# Patient Record
Sex: Male | Born: 1935 | Race: White | Hispanic: No | Marital: Married | Smoking: Never smoker
Health system: Southern US, Community
[De-identification: ages and names within clinical notes are randomized; demographics above are authoritative.]

## PROBLEM LIST (undated history)

## (undated) DIAGNOSIS — G001 Pneumococcal meningitis: Secondary | ICD-10-CM

## (undated) DIAGNOSIS — K225 Diverticulum of esophagus, acquired: Secondary | ICD-10-CM

## (undated) DIAGNOSIS — C61 Malignant neoplasm of prostate: Secondary | ICD-10-CM

## (undated) HISTORY — DX: Diverticulum of esophagus, acquired: K22.5

## (undated) HISTORY — DX: Pneumococcal meningitis: G00.1

## (undated) HISTORY — DX: Malignant neoplasm of prostate: C61

## (undated) HISTORY — PX: BRAIN SURGERY: SHX531

---

## 1983-12-29 DIAGNOSIS — I341 Nonrheumatic mitral (valve) prolapse: Secondary | ICD-10-CM

## 1983-12-29 HISTORY — DX: Nonrheumatic mitral (valve) prolapse: I34.1

## 2007-12-29 HISTORY — PX: PROSTATE CRYOABLATION: SUR358

## 2012-12-28 HISTORY — PX: TESTICLE REMOVAL: SHX68

## 2021-10-27 ENCOUNTER — Other Ambulatory Visit: Payer: Self-pay

## 2021-10-27 ENCOUNTER — Encounter: Payer: Self-pay | Admitting: Adult Health

## 2021-10-27 ENCOUNTER — Non-Acute Institutional Stay: Payer: Medicare Other | Admitting: Adult Health

## 2021-10-27 VITALS — BP 138/72 | HR 57 | Temp 96.4°F | Ht 73.0 in | Wt 187.8 lb

## 2021-10-27 DIAGNOSIS — R269 Unspecified abnormalities of gait and mobility: Secondary | ICD-10-CM

## 2021-10-27 DIAGNOSIS — K5901 Slow transit constipation: Secondary | ICD-10-CM

## 2021-10-27 DIAGNOSIS — C61 Malignant neoplasm of prostate: Secondary | ICD-10-CM | POA: Diagnosis not present

## 2021-10-27 DIAGNOSIS — I341 Nonrheumatic mitral (valve) prolapse: Secondary | ICD-10-CM | POA: Diagnosis not present

## 2021-10-27 MED ORDER — SULFAMETHOXAZOLE-TRIMETHOPRIM 800-160 MG PO TABS: 1.0000 | ORAL_TABLET | Freq: Two times a day (BID) | ORAL | 1 refills | Status: DC
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
## 2021-10-28 LAB — LIPID PANEL
Cholesterol: 168 (ref 0–200)
HDL: 52 (ref 35–70)
LDL Cholesterol: 98
LDl/HDL Ratio: 3.2
Triglycerides: 93 (ref 40–160)

## 2021-10-28 LAB — CBC AND DIFFERENTIAL
HCT: 38 — AB (ref 41–53)
Hemoglobin: 13 — AB (ref 13.5–17.5)
Platelets: 224 (ref 150–399)
WBC: 7.4

## 2021-10-28 LAB — BASIC METABOLIC PANEL
BUN: 30 — AB (ref 4–21)
CO2: 22 (ref 13–22)
Chloride: 107 (ref 99–108)
Creatinine: 1.2 (ref 0.6–1.3)
Glucose: 103
Potassium: 5 (ref 3.4–5.3)
Sodium: 140 (ref 137–147)

## 2021-10-28 LAB — COMPREHENSIVE METABOLIC PANEL
Albumin: 4 (ref 3.5–5.0)
Calcium: 8.8 (ref 8.7–10.7)
Globulin: 2.3

## 2021-10-28 LAB — HEPATIC FUNCTION PANEL
ALT: 13 (ref 10–40)
AST: 16 (ref 14–40)
Alkaline Phosphatase: 76 (ref 25–125)

## 2021-10-28 LAB — CBC: RBC: 4.06 (ref 3.87–5.11)

## 2021-10-28 LAB — TSH: TSH: 2.02 (ref 0.41–5.90)

## 2021-11-04 ENCOUNTER — Telehealth: Payer: Self-pay | Admitting: *Deleted

## 2021-11-04 NOTE — Telephone Encounter (Signed)
Patient called and stated that he had blood work done a week ago and he is wanting to know the results from it.   Please Advise.

## 2021-11-04 NOTE — Telephone Encounter (Signed)
I have contacted Aaron Harmon the clinic nurse at [place] and she will notify him of his lab results.

## 2022-01-20 ENCOUNTER — Emergency Department (HOSPITAL_COMMUNITY): Payer: Medicare Other

## 2022-01-20 ENCOUNTER — Other Ambulatory Visit: Payer: Self-pay

## 2022-01-20 ENCOUNTER — Inpatient Hospital Stay (HOSPITAL_COMMUNITY)
Admission: EM | Admit: 2022-01-20 | Discharge: 2022-01-24 | DRG: 378 | Disposition: A | Discharge status: Home / Self Care | Payer: Medicare Other | Attending: Internal Medicine | Admitting: Internal Medicine

## 2022-01-20 ENCOUNTER — Encounter (HOSPITAL_COMMUNITY): Payer: Self-pay | Admitting: Emergency Medicine

## 2022-01-20 DIAGNOSIS — D61818 Other pancytopenia: Secondary | ICD-10-CM | POA: Diagnosis present

## 2022-01-20 DIAGNOSIS — Z23 Encounter for immunization: Secondary | ICD-10-CM

## 2022-01-20 DIAGNOSIS — Z792 Long term (current) use of antibiotics: Secondary | ICD-10-CM

## 2022-01-20 DIAGNOSIS — K922 Gastrointestinal hemorrhage, unspecified: Secondary | ICD-10-CM

## 2022-01-20 DIAGNOSIS — K5731 Diverticulosis of large intestine without perforation or abscess with bleeding: Secondary | ICD-10-CM | POA: Diagnosis not present

## 2022-01-20 DIAGNOSIS — Z9079 Acquired absence of other genital organ(s): Secondary | ICD-10-CM

## 2022-01-20 DIAGNOSIS — I341 Nonrheumatic mitral (valve) prolapse: Secondary | ICD-10-CM | POA: Diagnosis present

## 2022-01-20 DIAGNOSIS — Z79899 Other long term (current) drug therapy: Secondary | ICD-10-CM

## 2022-01-20 DIAGNOSIS — K648 Other hemorrhoids: Secondary | ICD-10-CM | POA: Diagnosis present

## 2022-01-20 DIAGNOSIS — Z881 Allergy status to other antibiotic agents status: Secondary | ICD-10-CM

## 2022-01-20 DIAGNOSIS — Z8546 Personal history of malignant neoplasm of prostate: Secondary | ICD-10-CM

## 2022-01-20 DIAGNOSIS — K635 Polyp of colon: Secondary | ICD-10-CM | POA: Diagnosis present

## 2022-01-20 DIAGNOSIS — Z800 Family history of malignant neoplasm of digestive organs: Secondary | ICD-10-CM

## 2022-01-20 DIAGNOSIS — Z20822 Contact with and (suspected) exposure to covid-19: Secondary | ICD-10-CM | POA: Diagnosis present

## 2022-01-20 DIAGNOSIS — D62 Acute posthemorrhagic anemia: Secondary | ICD-10-CM | POA: Diagnosis present

## 2022-01-20 DIAGNOSIS — Z66 Do not resuscitate: Secondary | ICD-10-CM | POA: Diagnosis present

## 2022-01-20 DIAGNOSIS — Z8661 Personal history of infections of the central nervous system: Secondary | ICD-10-CM

## 2022-01-20 LAB — CBC
HCT: 24 % — ABNORMAL LOW (ref 39.0–52.0)
Hemoglobin: 8.1 g/dL — ABNORMAL LOW (ref 13.0–17.0)
MCH: 31.8 pg (ref 26.0–34.0)
MCHC: 33.8 g/dL (ref 30.0–36.0)
MCV: 94.1 fL (ref 80.0–100.0)
Platelets: 92 10*3/uL — ABNORMAL LOW (ref 150–400)
RBC: 2.55 MIL/uL — ABNORMAL LOW (ref 4.22–5.81)
RDW: 15.5 % (ref 11.5–15.5)
WBC: 1.4 10*3/uL — CL (ref 4.0–10.5)
nRBC: 0 % (ref 0.0–0.2)

## 2022-01-20 LAB — PROTIME-INR
INR: 1 (ref 0.8–1.2)
Prothrombin Time: 13.6 seconds (ref 11.4–15.2)

## 2022-01-20 LAB — COMPREHENSIVE METABOLIC PANEL
ALT: 16 U/L (ref 0–44)
AST: 18 U/L (ref 15–41)
Albumin: 3.9 g/dL (ref 3.5–5.0)
Alkaline Phosphatase: 59 U/L (ref 38–126)
Anion gap: 6 (ref 5–15)
BUN: 29 mg/dL — ABNORMAL HIGH (ref 8–23)
CO2: 24 mmol/L (ref 22–32)
Calcium: 8.8 mg/dL — ABNORMAL LOW (ref 8.9–10.3)
Chloride: 108 mmol/L (ref 98–111)
Creatinine, Ser: 1.12 mg/dL (ref 0.61–1.24)
GFR, Estimated: >60 mL/min (ref >60)
Glucose, Bld: 129 mg/dL — ABNORMAL HIGH (ref 70–99)
Potassium: 4.2 mmol/L (ref 3.5–5.1)
Sodium: 138 mmol/L (ref 135–145)
Total Bilirubin: 0.8 mg/dL (ref 0.3–1.2)
Total Protein: 6.7 g/dL (ref 6.5–8.1)

## 2022-01-20 LAB — POC OCCULT BLOOD, ED: Fecal Occult Bld: POSITIVE — AB

## 2022-01-20 MED ORDER — LACTATED RINGERS IV BOLUS
1000.0000 mL | Freq: Once | INTRAVENOUS | Status: AC
  Administered 2022-01-20: 22:00:00 1000 mL via INTRAVENOUS

## 2022-01-20 MED ORDER — IOHEXOL 350 MG/ML SOLN
100.0000 mL | Freq: Once | INTRAVENOUS | Status: AC | PRN
  Administered 2022-01-20: 23:00:00 100 mL via INTRAVENOUS

## 2022-01-20 MED ORDER — PANTOPRAZOLE SODIUM 40 MG IV SOLR
40.0000 mg | Freq: Once | INTRAVENOUS | Status: AC
Start: 2022-01-20 — End: 2022-01-20
  Administered 2022-01-20: 22:00:00 40 mg via INTRAVENOUS
  Filled 2022-01-20: qty 40

## 2022-01-20 MED ORDER — SODIUM CHLORIDE 0.9 % IV SOLN
10.0000 mL/h | Freq: Once | INTRAVENOUS | Status: AC
  Administered 2022-01-21: 02:00:00 10 mL/h via INTRAVENOUS

## 2022-01-20 NOTE — ED Notes (Addendum)
Pt was unable to stand up to do the 3 minute stand ortho vital. Pt says it was because he was starting to get very dizzy.

## 2022-01-20 NOTE — ED Triage Notes (Signed)
Pt arrived via EMS from [place]. Pt had a bowel movement today and reports 50cc of bright red blood. Pt denies pain, nausea, or vomiting

## 2022-01-20 NOTE — ED Provider Notes (Signed)
[place] DEPT Provider Note   CSN: [id number] Arrival date & time: 01/20/22  2000     History  Chief Complaint  Patient presents with   Rectal Bleeding    Aaron Harmon is a 88 y.o. male.  Is a history of constipation and takes Metamucil daily.  He had a normal bowel movement this morning and had some blood and clots on the toilet paper although no red in the pool.  This evening while he was urinating he felt like he needed to pass gas and had what he thinks was 50 to 100 cc of red blood with some clots.  No abdominal pain.  Not on any blood thinners.  Did feel little dizzy.  Denies any chest pain or shortness of breath.  Does not have a GI doctor.  The history is provided by the patient.  Rectal Bleeding Quality:  Maroon Amount:  Moderate Timing:  Sporadic Chronicity:  New Context: defecation and spontaneously   Context: not rectal pain   Similar prior episodes: no   Relieved by:  None tried Worsened by:  Nothing Ineffective treatments:  None tried Associated symptoms: dizziness   Associated symptoms: no abdominal pain, no epistaxis, no fever, no hematemesis, no loss of consciousness and no vomiting   Risk factors: no anticoagulant use       Home Medications Prior to Admission medications   Medication Sig Start Date End Date Taking? Authorizing Provider  MAGNESIUM CITRATE PO Take 500 mg by mouth daily.    [provider]  psyllium (METAMUCIL) 58.6 % powder Take 1 packet by mouth daily.    [provider]  sulfamethoxazole-trimethoprim (BACTRIM DS) 800-160 MG tablet Take 1 tablet by mouth 2 (two) times daily as needed.    [provider]      Allergies    Ciprofloxacin    Review of Systems   Review of Systems  Constitutional:  Negative for fever.  HENT:  Negative for nosebleeds.   Eyes:  Negative for visual disturbance.  Respiratory:  Negative for cough.   Cardiovascular:  Negative for chest pain.   Gastrointestinal:  Positive for anal bleeding, blood in stool and hematochezia. Negative for abdominal pain, hematemesis, nausea and vomiting.  Genitourinary:  Negative for dysuria.  Musculoskeletal:  Negative for neck pain.  Skin:  Negative for rash.  Neurological:  Positive for dizziness. Negative for loss of consciousness.   Physical Exam Updated Vital Signs BP (!) 151/75    Pulse 75    Temp 97.8 F (36.6 C) (Oral)    Resp 20    Ht 6\' 1"  (1.854 m)    Wt 81.6 kg    SpO2 98%    BMI 23.75 kg/m  Physical Exam Vitals and nursing note reviewed.  Constitutional:      General: He is not in acute distress.    Appearance: Normal appearance. He is well-developed.  HENT:     Head: Normocephalic and atraumatic.  Eyes:     Conjunctiva/sclera: Conjunctivae normal.  Cardiovascular:     Rate and Rhythm: Normal rate and regular rhythm.     Heart sounds: Murmur heard.  Pulmonary:     Effort: Pulmonary effort is normal. No respiratory distress.     Breath sounds: Normal breath sounds.  Abdominal:     Palpations: Abdomen is soft.     Tenderness: There is no abdominal tenderness. There is no guarding or rebound.  Musculoskeletal:        General: No  swelling.     Cervical back: Neck supple.     Right lower leg: No edema.     Left lower leg: No edema.  Skin:    General: Skin is warm and dry.     Capillary Refill: Capillary refill takes less than 2 seconds.  Neurological:     General: No focal deficit present.     Mental Status: He is alert.  Psychiatric:        Mood and Affect: Mood normal.    ED Results / Procedures / Treatments   Labs (all labs ordered are listed, but only abnormal results are displayed) Labs Reviewed  COMPREHENSIVE METABOLIC PANEL - Abnormal; Notable for the following components:      Result Value   Glucose, Bld 129 (*)    BUN 29 (*)    Calcium 8.8 (*)    All other components within normal limits  CBC - Abnormal; Notable for the following components:   WBC 1.4  (*)    RBC 2.55 (*)    Hemoglobin 8.1 (*)    HCT 24.0 (*)    Platelets 92 (*)    All other components within normal limits  POC OCCULT BLOOD, ED - Abnormal; Notable for the following components:   Fecal Occult Bld POSITIVE (*)    All other components within normal limits  RESP PANEL BY RT-PCR (FLU A&B, COVID) ARPGX2  PROTIME-INR  BASIC METABOLIC PANEL  CBC WITH DIFFERENTIAL/PLATELET  TYPE AND SCREEN  PREPARE RBC (CROSSMATCH)  ABO/RH    EKG EKG Interpretation  Date/Time:  Tuesday January 20 2022 20:18:09 EST Ventricular Rate:  69 PR Interval:  204 QRS Duration: 151 QT Interval:  437 QTC Calculation: 469 R Axis:   39 Text Interpretation: Sinus rhythm Probable left atrial enlargement Right bundle branch block No old tracing to compare Confirmed by Aletta Edouard [phone]) on 01/20/2022 10:01:11 PM  Radiology CT Angio Abd/Pel W and/or Wo Contrast  Result Date: 01/20/2022 CLINICAL DATA:  GI bleed, lower.  Bright red blood EXAM: CTA ABDOMEN AND PELVIS WITHOUT AND WITH CONTRAST TECHNIQUE: Multidetector CT imaging of the abdomen and pelvis was performed using the standard protocol during bolus administration of intravenous contrast. Multiplanar reconstructed images and MIPs were obtained and reviewed to evaluate the vascular anatomy. RADIATION DOSE REDUCTION: This exam was performed according to the departmental dose-optimization program which includes automated exposure control, adjustment of the mA and/or kV according to patient size and/or use of iterative reconstruction technique. CONTRAST:  129mL OMNIPAQUE IOHEXOL 350 MG/ML SOLN COMPARISON:  None. FINDINGS: VASCULAR Aorta: Aortic atherosclerosis.  No aneurysm or dissection. Celiac: Patent. Mild stenosis proximally likely related to median arcuate ligament. SMA: Patent. Renals: Patent bilaterally. Small left lower pole accessory renal artery. IMA: Patent Inflow: Atherosclerotic disease, patent. Proximal Outflow: Mild atherosclerotic  disease, patent. Veins: No obvious venous abnormality within the limitations of this arterial phase study. Review of the MIP images confirms the above findings. NON-VASCULAR Lower chest: No acute abnormality. Hepatobiliary: No focal hepatic abnormality. Gallbladder unremarkable. Pancreas: No focal abnormality or ductal dilatation. Spleen: Normal size. 2 cm lower density lesion in the upper pole of the spleen, likely cyst or hemangioma. Adrenals/Urinary Tract: Normal adrenal glands. Bilateral renal cysts. 16 mm stone layering in the left renal pelvis. Mild fullness of the left renal pelvis and collecting system. Ureters are decompressed. This may be related to chronic UPJ obstruction. Urinary bladder unremarkable. Stomach/Bowel: Diffuse colonic diverticulosis, most pronounced in the left colon. No active diverticulitis. No visible active  contrast extravasation to localize GI bleed. Stomach and small bowel decompressed, unremarkable. Lymphatic: No adenopathy Reproductive: Prostate calcifications. Other: No free fluid or free air. Musculoskeletal: No acute bony abnormality. IMPRESSION: VASCULAR Diffuse aortic atherosclerosis. No evidence of aneurysm or dissection. No contrast extravasation within the colon to localize GI bleed. NON-VASCULAR Extensive colonic diverticulosis.  No active diverticulitis. 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 16 mm nonobstructing stone layering dependently in the left renal pelvis. Bilateral renal cysts. Electronically Signed   By: Rolm Baptise M.D.   On: 01/20/2022 23:14    Procedures .Critical Care Performed by: Hayden Rasmussen, MD Authorized by: Hayden Rasmussen, MD   Critical care provider statement:    Critical care time (minutes):  45   Critical care time was exclusive of:  Separately billable procedures and treating other patients   Critical care was necessary to treat or prevent imminent or life-threatening  deterioration of the following conditions:  Circulatory failure   Critical care was time spent personally by me on the following activities:  Development of treatment plan with patient or surrogate, discussions with consultants, evaluation of patient's response to treatment, examination of patient, obtaining history from patient or surrogate, ordering and performing treatments and interventions, ordering and review of laboratory studies, ordering and review of radiographic studies, pulse oximetry, re-evaluation of patient's condition and review of old charts   I assumed direction of critical care for this patient from another provider in my specialty: no      Medications Ordered in ED Medications  acetaminophen (TYLENOL) tablet 650 mg (has no administration in time range)    Or  acetaminophen (TYLENOL) suppository 650 mg (has no administration in time range)  lactated ringers infusion (0 mLs Intravenous Hold 01/21/22 0147)  pantoprazole (PROTONIX) injection 40 mg (has no administration in time range)  lactated ringers bolus 1,000 mL (0 mLs Intravenous Stopped 01/21/22 0053)  pantoprazole (PROTONIX) injection 40 mg (40 mg Intravenous Given 01/20/22 2201)  0.9 %  sodium chloride infusion (10 mL/hr Intravenous New Bag/Given 01/21/22 0225)  iohexol (OMNIPAQUE) 350 MG/ML injection 100 mL (100 mLs Intravenous Contrast Given 01/20/22 2252)    ED Course/ Medical Decision Making/ A&P Clinical Course as of 01/21/22 0913  Tue Jan 20, 2022  2148 Rectal exam done with tech as chaperone.  Normal tone no masses.  Maroon stool in vault [MB]  2329 Discussed with GI on-call Dr. Fabio Pierce who will see him in the morning unless becomes unstable. [MB]  2336 Discussed with Dr. Hal Hope [facility] who will evaluate the patient for admission. [MB]    Clinical Course User Index [MB] Hayden Rasmussen, MD                           Medical Decision Making Amount and/or Complexity of Data  Reviewed Labs: ordered. Radiology: ordered.  Risk Prescription drug management. Decision regarding hospitalization.  This patient complains of maroon blood from rectum; this involves an extensive number of treatment Options and is a complaint that carries with it a high risk of complications and Morbidity. The differential includes upper GI bleed, lower GI bleed, AVM diverticulitis, anemia, shock  I ordered, reviewed and interpreted labs, which included CBC which shows pancytopenia and marked drop in hemoglobin from prior 2 months ago, chemistries fairly unremarkable, INR normal, fecal occult positive I ordered medication IV fluids IV PPI transfusion of packed red blood cells after consent  I ordered imaging studies which included CT angio abdomen and pelvis and I independently    visualized and interpreted imaging which showed diverticulosis, no localization of active bleed Additional history obtained from patient's wife Previous records obtained and reviewed in epic no recent admissions I consulted Dr. Fabio Pierce GI and Dr. Hal Hope [facility] and discussed lab and imaging findings  Critical Interventions: Transfusion of packed red blood cells for active GI bleed  After the interventions stated above, I reevaluated the patient and found patient to be fairly comfortable appearing.  Blood transfusion still pending.  Reviewed results of work-up with patient and he is agreeable to admission for further evaluation.           Final Clinical Impression(s) / ED Diagnoses Final diagnoses:  Gastrointestinal hemorrhage, unspecified gastrointestinal hemorrhage type  Pancytopenia [facility])    Rx / DC Orders ED Discharge Orders     None         Hayden Rasmussen, MD 01/21/22 [phone]

## 2022-01-21 ENCOUNTER — Encounter (HOSPITAL_COMMUNITY): Payer: Self-pay | Admitting: Internal Medicine

## 2022-01-21 DIAGNOSIS — D62 Acute posthemorrhagic anemia: Secondary | ICD-10-CM | POA: Diagnosis present

## 2022-01-21 DIAGNOSIS — Z9079 Acquired absence of other genital organ(s): Secondary | ICD-10-CM | POA: Diagnosis not present

## 2022-01-21 DIAGNOSIS — Z881 Allergy status to other antibiotic agents status: Secondary | ICD-10-CM | POA: Diagnosis not present

## 2022-01-21 DIAGNOSIS — Z8546 Personal history of malignant neoplasm of prostate: Secondary | ICD-10-CM | POA: Diagnosis not present

## 2022-01-21 DIAGNOSIS — Z66 Do not resuscitate: Secondary | ICD-10-CM | POA: Diagnosis present

## 2022-01-21 DIAGNOSIS — Z792 Long term (current) use of antibiotics: Secondary | ICD-10-CM | POA: Diagnosis not present

## 2022-01-21 DIAGNOSIS — Z800 Family history of malignant neoplasm of digestive organs: Secondary | ICD-10-CM | POA: Diagnosis not present

## 2022-01-21 DIAGNOSIS — K635 Polyp of colon: Secondary | ICD-10-CM | POA: Diagnosis present

## 2022-01-21 DIAGNOSIS — K5731 Diverticulosis of large intestine without perforation or abscess with bleeding: Secondary | ICD-10-CM | POA: Diagnosis present

## 2022-01-21 DIAGNOSIS — Z20822 Contact with and (suspected) exposure to covid-19: Secondary | ICD-10-CM | POA: Diagnosis present

## 2022-01-21 DIAGNOSIS — Z23 Encounter for immunization: Secondary | ICD-10-CM | POA: Diagnosis not present

## 2022-01-21 DIAGNOSIS — I341 Nonrheumatic mitral (valve) prolapse: Secondary | ICD-10-CM | POA: Diagnosis present

## 2022-01-21 DIAGNOSIS — D61818 Other pancytopenia: Secondary | ICD-10-CM | POA: Diagnosis present

## 2022-01-21 DIAGNOSIS — K648 Other hemorrhoids: Secondary | ICD-10-CM | POA: Diagnosis present

## 2022-01-21 DIAGNOSIS — K922 Gastrointestinal hemorrhage, unspecified: Secondary | ICD-10-CM | POA: Diagnosis present

## 2022-01-21 DIAGNOSIS — Z8661 Personal history of infections of the central nervous system: Secondary | ICD-10-CM | POA: Diagnosis not present

## 2022-01-21 DIAGNOSIS — Z79899 Other long term (current) drug therapy: Secondary | ICD-10-CM | POA: Diagnosis not present

## 2022-01-21 LAB — CBC WITH DIFFERENTIAL/PLATELET
Abs Immature Granulocytes: 0.03 10*3/uL (ref 0.00–0.07)
Basophils Absolute: 0 10*3/uL (ref 0.0–0.1)
Basophils Relative: 0 %
Eosinophils Absolute: 0.1 10*3/uL (ref 0.0–0.5)
Eosinophils Relative: 1 %
HCT: 39.7 % (ref 39.0–52.0)
Hemoglobin: 13.7 g/dL (ref 13.0–17.0)
Immature Granulocytes: 0 %
Lymphocytes Relative: 16 %
Lymphs Abs: 1.2 10*3/uL (ref 0.7–4.0)
MCH: 31.6 pg (ref 26.0–34.0)
MCHC: 34.5 g/dL (ref 30.0–36.0)
MCV: 91.5 fL (ref 80.0–100.0)
Monocytes Absolute: 0.6 10*3/uL (ref 0.1–1.0)
Monocytes Relative: 7 %
Neutro Abs: 5.8 10*3/uL (ref 1.7–7.7)
Neutrophils Relative %: 76 %
Platelets: 174 10*3/uL (ref 150–400)
RBC: 4.34 MIL/uL (ref 4.22–5.81)
RDW: 14.6 % (ref 11.5–15.5)
WBC: 7.6 10*3/uL (ref 4.0–10.5)
nRBC: 0 % (ref 0.0–0.2)

## 2022-01-21 LAB — BASIC METABOLIC PANEL
Anion gap: 7 (ref 5–15)
BUN: 25 mg/dL — ABNORMAL HIGH (ref 8–23)
CO2: 25 mmol/L (ref 22–32)
Calcium: 8.4 mg/dL — ABNORMAL LOW (ref 8.9–10.3)
Chloride: 106 mmol/L (ref 98–111)
Creatinine, Ser: 0.89 mg/dL (ref 0.61–1.24)
GFR, Estimated: >60 mL/min (ref >60)
Glucose, Bld: 100 mg/dL — ABNORMAL HIGH (ref 70–99)
Potassium: 3.9 mmol/L (ref 3.5–5.1)
Sodium: 138 mmol/L (ref 135–145)

## 2022-01-21 LAB — RESP PANEL BY RT-PCR (FLU A&B, COVID) ARPGX2
Influenza A by PCR: NEGATIVE
Influenza B by PCR: NEGATIVE
SARS Coronavirus 2 by RT PCR: NEGATIVE

## 2022-01-21 LAB — GLUCOSE, CAPILLARY: Glucose-Capillary: 94 mg/dL (ref 70–99)

## 2022-01-21 LAB — ABO/RH: ABO/RH(D): O NEG

## 2022-01-21 LAB — PREPARE RBC (CROSSMATCH)

## 2022-01-21 MED ORDER — ACETAMINOPHEN 650 MG RE SUPP: 650.0000 mg | Freq: Four times a day (QID) | RECTAL | Status: DC | PRN

## 2022-01-21 MED ORDER — PANTOPRAZOLE SODIUM 40 MG IV SOLR: 40.0000 mg | Freq: Two times a day (BID) | INTRAVENOUS | Status: DC

## 2022-01-21 MED ORDER — PNEUMOCOCCAL VAC POLYVALENT 25 MCG/0.5ML IJ INJ
0.5000 mL | INJECTION | INTRAMUSCULAR | Status: DC
  Filled 2022-01-21: qty 0.5

## 2022-01-21 MED ORDER — ACETAMINOPHEN 325 MG PO TABS: 650.0000 mg | ORAL_TABLET | Freq: Four times a day (QID) | ORAL | Status: DC | PRN

## 2022-01-21 MED ORDER — PANTOPRAZOLE SODIUM 40 MG IV SOLR
40.0000 mg | Freq: Two times a day (BID) | INTRAVENOUS | Status: DC
  Administered 2022-01-21 – 2022-01-24 (×5): 40 mg via INTRAVENOUS
  Filled 2022-01-21 (×6): qty 40

## 2022-01-21 MED ORDER — LACTATED RINGERS IV SOLN: INTRAVENOUS | Status: AC

## 2022-01-21 NOTE — Progress Notes (Signed)
PROGRESS NOTE  Edrian Melucci XFG:[id number] DOB: [birth year] DOA: 01/20/2022 PCP: Virgie Dad, MD  Brief History   Elton Heid is a 88 y.o. male with history of mitral valve prolapse, prostate cancer status post prostatectomy presents to the ER after patient had a large bloody bowel movement.  Patient states yesterday morning when he went to the bathroom and wiped he had some blood in the tissue.  Later in the evening when he went to the bathroom he had a large bloody bowel movement.  At this point he decided to come to the ER.  Denies any abdominal pain nausea vomiting.  Has been feeling weak since morning.  Does not take any blood thinners.   ED Course: In the ER patient had another bowel movement which was bloody.  Patient's hemoglobin did drop 5 g when compared to November 2022 about 2 months ago.  Patient blood work also showed pancytopenia when compared to the 1 done 2 months ago.  CT angiogram of the abdomen pelvis did not show any signs of active GI bleed.  ER physician discussed with the on-call gastroenterologist Dr. Alessandra Bevels will be seeing patient in consult.  Patient has been ordered 2 units of PRBC given the magnitude of drop in hemoglobin and having another episode of GI bleed.  COVID test negative.  CBC reveals pancytopenia. Etiology is unknown. Will follow CBC.  The patient has been evaluated by Dr. Therisa Doyne. She has recommended inpatient observation for another 24 hours and states that the patient may be discharged to home in am if no further bleeding by tomorrow am. If he does demonstrate further hematochezia or hemodynamic instability, he recommends IR eval for embolization. This is assumed to be a diverticular bleed.  Consultants  Gastroenterology  Procedures  None  Antibiotics   Anti-infectives (From admission, onward)    None      Subjective  The patient is resting comfortably. No further bloody bowel movements. No new complaints.   Objective   Vitals:   Vitals:   01/21/22 1600 01/21/22 1715  BP: (!) 170/84 (!) 166/73  Pulse: 73 68  Resp: 18 17  Temp:  97.8 F (36.6 C)  SpO2: 96% 99%    Exam:  Constitutional:  Appears calm and comfortable Eyes:  pupils and irises appear normal Normal lids and conjunctivae ENMT:  grossly normal hearing  Lips appear normal external ears, nose appear normal Oropharynx: mucosa, tongue,posterior pharynx appear normal Neck:  neck appears normal, no masses, normal ROM, supple no thyromegaly Respiratory:  CTA bilaterally, no w/r/r.  Respiratory effort normal. No retractions or accessory muscle use Cardiovascular:  RRR, no m/r/g No LE extremity edema   Normal pedal pulses Abdomen:  Abdomen appears normal; no tenderness or masses No hernias No HSM Musculoskeletal:  Digits/nails BUE: no clubbing, cyanosis, petechiae, infection exam of joints, bones, muscles of at least one of following: head/neck, RUE, LUE, RLE, LLE   strength and tone normal, no atrophy, no abnormal movements No tenderness, masses Normal ROM, no contractures  gait and station Skin:  No rashes, lesions, ulcers palpation of skin: no induration or nodules Neurologic:  CN 2-12 intact Sensation all 4 extremities intact Psychiatric:  Mental status Mood, affect appropriate Orientation to person, place, time  judgment and insight appear intact     I have personally reviewed the following:   Today's Data  Vitals  Lab Data  CBC  Micro Data    Imaging  CT abdomen and pelvis  Cardiology Data  EKG  Other Data    Scheduled Meds:  pantoprazole (PROTONIX) IV  40 mg Intravenous Q12H   Continuous Infusions:  lactated ringers 75 mL/hr at 01/21/22 1716    Principal Problem:   Acute GI bleeding Active Problems:   Pancytopenia ([place])   GI bleed   LOS: 0 days   A & P  Acute GI bleeding -CT scan does show some diverticulosis.  Patient states he has never had a colonoscopy before. He has been evaluated by Dr.  Therisa Doyne. She has recommended inpatient observation for another 24 hours and states that the patient may be discharged to home in am if no further bleeding by tomorrow am. If he does demonstrate further hematochezia or hemodynamic instability, he recommends IR eval for embolization. This is assumed to be a diverticular bleed. Acute blood loss anemia: The patient's hemoglobin had dropped from 13 gm on 10/28/2022 to 8.0 grams upon presentation. The patient has received 2 units in transfusion. Hemoglobin this morning is 13/7. Monitor.  Pancytopenia which is new for the patient when compared to the lab work done in November 2022 2 months ago.  Follow CBC Will need hematology consult eventually. Prior history of mitral valve prolapse.  No acute issues. History of prostate cancer status post prostatectomy.     DVT prophylaxis: SCDs.  Avoiding anticoagulation in the setting of GI bleed. Code Status: Full code. Family Communication: Discussed with patient. Disposition Plan: Home when stable.   Rasheka Denard, DO [facility] Direct contact: see [email or web address]  7PM-7AM contact night coverage as above 01/21/2022, 5:33 PM  LOS: 0 days
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
## 2022-01-22 ENCOUNTER — Inpatient Hospital Stay (HOSPITAL_COMMUNITY): Payer: Medicare Other

## 2022-01-22 DIAGNOSIS — K922 Gastrointestinal hemorrhage, unspecified: Secondary | ICD-10-CM | POA: Diagnosis not present

## 2022-01-22 LAB — BPAM RBC
Blood Product Expiration Date: 202302072359
Blood Product Expiration Date: 202302072359
ISSUE DATE / TIME: 202301250211
ISSUE DATE / TIME: 202301250602
Unit Type and Rh: 9500
Unit Type and Rh: 9500

## 2022-01-22 LAB — CBC WITH DIFFERENTIAL/PLATELET
Abs Immature Granulocytes: 0.04 10*3/uL (ref 0.00–0.07)
Basophils Absolute: 0 10*3/uL (ref 0.0–0.1)
Basophils Relative: 1 %
Eosinophils Absolute: 0.2 10*3/uL (ref 0.0–0.5)
Eosinophils Relative: 3 %
HCT: 38.7 % — ABNORMAL LOW (ref 39.0–52.0)
Hemoglobin: 13 g/dL (ref 13.0–17.0)
Immature Granulocytes: 1 %
Lymphocytes Relative: 23 %
Lymphs Abs: 1.6 10*3/uL (ref 0.7–4.0)
MCH: 30.8 pg (ref 26.0–34.0)
MCHC: 33.6 g/dL (ref 30.0–36.0)
MCV: 91.7 fL (ref 80.0–100.0)
Monocytes Absolute: 0.8 10*3/uL (ref 0.1–1.0)
Monocytes Relative: 11 %
Neutro Abs: 4.5 10*3/uL (ref 1.7–7.7)
Neutrophils Relative %: 61 %
Platelets: 169 10*3/uL (ref 150–400)
RBC: 4.22 MIL/uL (ref 4.22–5.81)
RDW: 14.6 % (ref 11.5–15.5)
WBC: 7.2 10*3/uL (ref 4.0–10.5)
nRBC: 0 % (ref 0.0–0.2)

## 2022-01-22 LAB — TYPE AND SCREEN
ABO/RH(D): O NEG
Antibody Screen: NEGATIVE
Unit division: 0
Unit division: 0

## 2022-01-22 LAB — GLUCOSE, CAPILLARY: Glucose-Capillary: 92 mg/dL (ref 70–99)

## 2022-01-22 MED ORDER — TECHNETIUM TC 99M-LABELED RED BLOOD CELLS IV KIT
20.0000 | PACK | Freq: Once | INTRAVENOUS | Status: AC
  Administered 2022-01-22: 20 via INTRAVENOUS

## 2022-01-22 NOTE — Progress Notes (Signed)
Patient refused CBG's.
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
## 2022-01-22 NOTE — Progress Notes (Signed)
Patient asked for wife and daughter to be occasionally updated. [name] (wife): [phone] [place] (Daughter): [phone]  Wife and daughter both updated on patient plan of care at this time.

## 2022-01-22 NOTE — Progress Notes (Signed)
[facility] Gastroenterology Progress Note  Aaron Harmon 88 y.o. 07/21/1933  CC: Rectal bleeding   Subjective: Patient did well overnight and had no further bleeding.  Patient was set up for discharge as there is no further rectal bleeding and hemoglobin normalized to 13.0. after provider left the room, RN messaged stating that patient had a large bloody/black bowel movement.  Denies abdominal pain.  Patient denies nausea/vomiting.  ROS : Review of Systems  Gastrointestinal:  Positive for blood in stool and melena. Negative for abdominal pain, constipation, diarrhea, heartburn, nausea and vomiting.  Genitourinary:  Negative for dysuria and urgency.     Objective: Vital signs in last 24 hours: Vitals:   01/22/22 0515 01/22/22 0847  BP: (!) 139/98 (!) 153/80  Pulse: 80 72  Resp: 18 18  Temp: 97.9 F (36.6 C) 98.3 F (36.8 C)  SpO2: 95% 100%    Physical Exam:  General:  Alert, cooperative, no distress, appears stated age  Head:  Normocephalic, without obvious abnormality, atraumatic  Eyes:  Anicteric sclera, EOM's intact  Lungs:   respirations unlabored, no respiratory distress  Heart:  No peripheral edema  Abdomen:   Soft, non-tender,  no masses,     Lab Results: Recent Labs    01/20/22 2041 01/21/22 1208  NA 138 138  K 4.2 3.9  CL 108 106  CO2 24 25  GLUCOSE 129* 100*  BUN 29* 25*  CREATININE 1.12 0.89  CALCIUM 8.8* 8.4*   Recent Labs    01/20/22 2041  AST 18  ALT 16  ALKPHOS 59  BILITOT 0.8  PROT 6.7  ALBUMIN 3.9   Recent Labs    01/21/22 1208 01/22/22 0433  WBC 7.6 7.2  NEUTROABS 5.8 4.5  HGB 13.7 13.0  HCT 39.7 38.7*  MCV 91.5 91.7  PLT 174 169   Recent Labs    01/20/22 2041  LABPROT 13.6  INR 1.0      Assessment GI bleed; possibly diverticular bleed - CTA ab/pelvis 1/24: Negative for active bleed. Extensive colonic diverticulosis without diverticulitis.  - hgb 13.0 - BUN 25, Cr. 0.89 - Occult positive   Pancytopenia - resolved.  Wbc 7.2 (1.4 yesterday), platelets 169 (92 yesterday).   Plan: Hgb stable. Patient had another bleeding episode. Ordered stat bleeding scan today (with IR consultation if positive).  Continue supportive care Continue daily CBC and transfuse as needed to maintain HGB > 7 [facility] will follow  Garnette Scheuermann PA-C 01/22/2022, 11:22 AM  Contact #  [phone]

## 2022-01-22 NOTE — TOC Transition Note (Signed)
Transition of Care [facility]) - CM/SW Discharge Note   Patient Details  Name: Jamarques Pinedo MRN: [id number] Date of Birth: 01/24/1933  Transition of Care [facility]) CM/SW Contact:  Trish Mage, LCSW Phone Number: 01/22/2022, 10:37 AM   Clinical Narrative:   Patient who is stable for discharge is open to working with [facility] PT upon release.  FAXed orders to [facility], agency with whom [employer]. No further needs identified.  TOC sign off.    Final next level of care: [place] Barriers to Discharge: No Barriers Identified   Patient Goals and CMS Choice        Discharge Placement                       Discharge Plan and Services                                     Social Determinants of Health (SDOH) Interventions     Readmission Risk Interventions No flowsheet data found.

## 2022-01-22 NOTE — Progress Notes (Signed)
PROGRESS NOTE    Aaron Harmon  ZOX:[id number] DOB: [birth year] DOA: 01/20/2022 PCP: Virgie Dad, MD   Brief Narrative: 88 year old male admitted with melena and blood in stool.  He was discharged today and was on his way out when he had a large bloody black BM.  Discharge was canceled.  GI recommended bleeding scan and possible IR consult if the scan is positive.    Assessment & Plan:   Principal Problem:   Acute GI bleeding Active Problems:   Pancytopenia ([place])   GI bleed   #1 GI bleed possible diverticular bleed his hemoglobin remained stable overnight.  However on his way out of being discharged patient had a huge large bloody bowel movement.  Discharge was canceled patient scheduled for bleeding scan.  GI following. IR consult if bleeding scan positive.  His hemoglobin was 8 on admission he received 2 units of packed RBCs.  His hemoglobin improved appropriately to 13.  His hemoglobin was 13.0 10/28/2021 has dropped from 13-8.0. On PPI twice daily while in hospital  #2 history of prostate cancer with prostatectomy  #3 history of mitral valve prolapse  #4 pancytopenia resolved wonder if the CBC from 01/20/2022 was not real.  Estimated body mass index is 23.75 kg/m as calculated from the following:   Height as of this encounter: 6\' 1"  (1.854 m).   Weight as of this encounter: 81.6 kg.  DVT prophylaxis: none due to gi bleed Code Status: full Family Communication: none at bedside  Disposition Plan:  Status is: Inpatient  Remains inpatient appropriate because: gi bleed   Consultants: gi  Procedures: none Antimicrobials none  Subjective:  Resting in bed anxious to go home  No bm overnight However after I saw him he had a large bloody bm  Objective: Vitals:   01/21/22 2114 01/22/22 0130 01/22/22 0515 01/22/22 0847  BP: (!) 145/66 (!) 141/63 (!) 139/98 (!) 153/80  Pulse: 61 63 80 72  Resp: 16 16 18 18   Temp: 97.6 F (36.4 C) 97.7 F (36.5 C) 97.9 F (36.6 C)  98.3 F (36.8 C)  TempSrc: Oral Oral Oral   SpO2: 94% 97% 95% 100%  Weight:      Height:        Intake/Output Summary (Last 24 hours) at 01/22/2022 1129 Last data filed at 01/21/2022 2336 Gross per 24 hour  Intake 30.1 ml  Output 450 ml  Net -419.9 ml   Filed Weights   01/20/22 2015  Weight: 81.6 kg    Examination:  General exam: Appears calm and comfortable  Respiratory system: Clear to auscultation. Respiratory effort normal. Cardiovascular system: S1 & S2 heard, RRR. No JVD, murmurs, rubs, gallops or clicks. No pedal edema. Gastrointestinal system: Abdomen is nondistended, soft and nontender. No organomegaly or masses felt. Normal bowel sounds heard. Central nervous system: Alert and oriented. No focal neurological deficits. Extremities: Symmetric 5 x 5 power. Skin: No rashes, lesions or ulcers Psychiatry: Judgement and insight appear normal. Mood & affect appropriate.     Data Reviewed: I have personally reviewed following labs and imaging studies  CBC: Recent Labs  Lab 01/20/22 2041 01/21/22 1208 01/22/22 0433  WBC 1.4* 7.6 7.2  NEUTROABS  --  5.8 4.5  HGB 8.1* 13.7 13.0  HCT 24.0* 39.7 38.7*  MCV 94.1 91.5 91.7  PLT 92* 174 811   Basic Metabolic Panel: Recent Labs  Lab 01/20/22 2041 01/21/22 1208  NA 138 138  K 4.2 3.9  CL 108 106  CO2  24 25  GLUCOSE 129* 100*  BUN 29* 25*  CREATININE 1.12 0.89  CALCIUM 8.8* 8.4*   GFR: Estimated Creatinine Clearance: 67.3 mL/min (by C-G formula based on SCr of 0.89 mg/dL). Liver Function Tests: Recent Labs  Lab 01/20/22 2041  AST 18  ALT 16  ALKPHOS 59  BILITOT 0.8  PROT 6.7  ALBUMIN 3.9   No results for input(s): LIPASE, AMYLASE in the last 168 hours. No results for input(s): AMMONIA in the last 168 hours. Coagulation Profile: Recent Labs  Lab 01/20/22 2041  INR 1.0   Cardiac Enzymes: No results for input(s): CKTOTAL, CKMB, CKMBINDEX, TROPONINI in the last 168 hours. BNP (last 3 results) No  results for input(s): PROBNP in the last 8760 hours. HbA1C: No results for input(s): HGBA1C in the last 72 hours. CBG: Recent Labs  Lab 01/21/22 1757 01/22/22 0029  GLUCAP 94 92   Lipid Profile: No results for input(s): CHOL, HDL, LDLCALC, TRIG, CHOLHDL, LDLDIRECT in the last 72 hours. Thyroid Function Tests: No results for input(s): TSH, T4TOTAL, FREET4, T3FREE, THYROIDAB in the last 72 hours. Anemia Panel: No results for input(s): VITAMINB12, FOLATE, FERRITIN, TIBC, IRON, RETICCTPCT in the last 72 hours. Sepsis Labs: No results for input(s): PROCALCITON, LATICACIDVEN in the last 168 hours.  Recent Results (from the past 240 hour(s))  Resp Panel by RT-PCR (Flu A&B, Covid) Nasopharyngeal Swab     Status: None   Collection Time: 01/20/22 11:29 PM   Specimen: Nasopharyngeal Swab; Nasopharyngeal(NP) swabs in vial transport medium  Result Value Ref Range Status   SARS Coronavirus 2 by RT PCR NEGATIVE NEGATIVE Final    Comment: (NOTE) SARS-CoV-2 target nucleic acids are NOT DETECTED.  The SARS-CoV-2 RNA is generally detectable in upper respiratory specimens during the acute phase of infection. The lowest concentration of SARS-CoV-2 viral copies this assay can detect is 138 copies/mL. A negative result does not preclude SARS-Cov-2 infection and should not be used as the sole basis for treatment or other patient management decisions. A negative result may occur with  improper specimen collection/handling, submission of specimen other than nasopharyngeal swab, presence of viral mutation(s) within the areas targeted by this assay, and inadequate number of viral copies(<138 copies/mL). A negative result must be combined with clinical observations, patient history, and epidemiological information. The expected result is Negative.  Fact Sheet for Patients:  EntrepreneurPulse.com.au  Fact Sheet for Healthcare Providers:   IncredibleEmployment.be  This test is no t yet approved or cleared by the [place] FDA and  has been authorized for detection and/or diagnosis of SARS-CoV-2 by FDA under an Emergency Use Authorization (EUA). This EUA will remain  in effect (meaning this test can be used) for the duration of the COVID-19 declaration under Section 564(b)(1) of the Act, 21 U.S.C.section 360bbb-3(b)(1), unless the authorization is terminated  or revoked sooner.       Influenza A by PCR NEGATIVE NEGATIVE Final   Influenza B by PCR NEGATIVE NEGATIVE Final    Comment: (NOTE) The Xpert Xpress SARS-CoV-2/FLU/RSV plus assay is intended as an aid in the diagnosis of influenza from Nasopharyngeal swab specimens and should not be used as a sole basis for treatment. Nasal washings and aspirates are unacceptable for Xpert Xpress SARS-CoV-2/FLU/RSV testing.  Fact Sheet for Patients: EntrepreneurPulse.com.au  Fact Sheet for Healthcare Providers: IncredibleEmployment.be  This test is not yet approved or cleared by the [place] FDA and has been authorized for detection and/or diagnosis of SARS-CoV-2 by FDA under an Emergency Use Authorization (EUA).  This EUA will remain in effect (meaning this test can be used) for the duration of the COVID-19 declaration under Section 564(b)(1) of the Act, 21 U.S.C. section 360bbb-3(b)(1), unless the authorization is terminated or revoked.  Performed at [facility], [place] [address] [place], [place] [address]          Radiology Studies: CT Angio Abd/Pel W and/or Wo Contrast  Result Date: 01/20/2022 CLINICAL DATA:  GI bleed, lower.  Bright red blood EXAM: CTA ABDOMEN AND PELVIS WITHOUT AND WITH CONTRAST TECHNIQUE: Multidetector CT imaging of the abdomen and pelvis was performed using the standard protocol during bolus administration of intravenous contrast. Multiplanar reconstructed images  and MIPs were obtained and reviewed to evaluate the vascular anatomy. RADIATION DOSE REDUCTION: This exam was performed according to the departmental dose-optimization program which includes automated exposure control, adjustment of the mA and/or kV according to patient size and/or use of iterative reconstruction technique. CONTRAST:  167mL OMNIPAQUE IOHEXOL 350 MG/ML SOLN COMPARISON:  None. FINDINGS: VASCULAR Aorta: Aortic atherosclerosis.  No aneurysm or dissection. Celiac: Patent. Mild stenosis proximally likely related to median arcuate ligament. SMA: Patent. Renals: Patent bilaterally. Small left lower pole accessory renal artery. IMA: Patent Inflow: Atherosclerotic disease, patent. Proximal Outflow: Mild atherosclerotic disease, patent. Veins: No obvious venous abnormality within the limitations of this arterial phase study. Review of the MIP images confirms the above findings. NON-VASCULAR Lower chest: No acute abnormality. Hepatobiliary: No focal hepatic abnormality. Gallbladder unremarkable. Pancreas: No focal abnormality or ductal dilatation. Spleen: Normal size. 2 cm lower density lesion in the upper pole of the spleen, likely cyst or hemangioma. Adrenals/Urinary Tract: Normal adrenal glands. Bilateral renal cysts. 16 mm stone layering in the left renal pelvis. Mild fullness of the left renal pelvis and collecting system. Ureters are decompressed. This may be related to chronic UPJ obstruction. Urinary bladder unremarkable. Stomach/Bowel: Diffuse colonic diverticulosis, most pronounced in the left colon. No active diverticulitis. No visible active contrast extravasation to localize GI bleed. Stomach and small bowel decompressed, unremarkable. Lymphatic: No adenopathy Reproductive: Prostate calcifications. Other: No free fluid or free air. Musculoskeletal: No acute bony abnormality. IMPRESSION: VASCULAR Diffuse aortic atherosclerosis. No evidence of aneurysm or dissection. No contrast extravasation within  the colon to localize GI bleed. NON-VASCULAR Extensive colonic diverticulosis.  No active diverticulitis. 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 16 mm nonobstructing stone layering dependently in the left renal pelvis. Bilateral renal cysts. Electronically Signed   By: Rolm Baptise M.D.   On: 01/20/2022 23:14        Scheduled Meds:  pantoprazole (PROTONIX) IV  40 mg Intravenous Q12H   pneumococcal 23 valent vaccine  0.5 mL Intramuscular Tomorrow-1000   Continuous Infusions:   LOS: 1 day     Georgette Shell, MD 01/22/2022, 11:29 AM

## 2022-01-23 DIAGNOSIS — K922 Gastrointestinal hemorrhage, unspecified: Secondary | ICD-10-CM | POA: Diagnosis not present

## 2022-01-23 LAB — CBC
HCT: 39.8 % (ref 39.0–52.0)
HCT: 44.9 % (ref 39.0–52.0)
Hemoglobin: 13.3 g/dL (ref 13.0–17.0)
Hemoglobin: 14.6 g/dL (ref 13.0–17.0)
MCH: 30.9 pg (ref 26.0–34.0)
MCH: 30.5 pg (ref 26.0–34.0)
MCHC: 33.4 g/dL (ref 30.0–36.0)
MCHC: 32.5 g/dL (ref 30.0–36.0)
MCV: 92.3 fL (ref 80.0–100.0)
MCV: 93.9 fL (ref 80.0–100.0)
Platelets: 186 10*3/uL (ref 150–400)
Platelets: 191 10*3/uL (ref 150–400)
RBC: 4.31 MIL/uL (ref 4.22–5.81)
RBC: 4.78 MIL/uL (ref 4.22–5.81)
RDW: 14 % (ref 11.5–15.5)
RDW: 13.9 % (ref 11.5–15.5)
WBC: 6.6 10*3/uL (ref 4.0–10.5)
WBC: 8.9 10*3/uL (ref 4.0–10.5)
nRBC: 0 % (ref 0.0–0.2)
nRBC: 0 % (ref 0.0–0.2)

## 2022-01-23 LAB — COMPREHENSIVE METABOLIC PANEL
ALT: 14 U/L (ref 0–44)
AST: 19 U/L (ref 15–41)
Albumin: 3.7 g/dL (ref 3.5–5.0)
Alkaline Phosphatase: 58 U/L (ref 38–126)
Anion gap: 6 (ref 5–15)
BUN: 17 mg/dL (ref 8–23)
CO2: 25 mmol/L (ref 22–32)
Calcium: 8.5 mg/dL — ABNORMAL LOW (ref 8.9–10.3)
Chloride: 106 mmol/L (ref 98–111)
Creatinine, Ser: 1 mg/dL (ref 0.61–1.24)
GFR, Estimated: >60 mL/min (ref >60)
Glucose, Bld: 86 mg/dL (ref 70–99)
Potassium: 3.9 mmol/L (ref 3.5–5.1)
Sodium: 137 mmol/L (ref 135–145)
Total Bilirubin: 1 mg/dL (ref 0.3–1.2)
Total Protein: 6.1 g/dL — ABNORMAL LOW (ref 6.5–8.1)

## 2022-01-23 MED ORDER — MAGNESIUM OXIDE -MG SUPPLEMENT 400 (240 MG) MG PO TABS
400.0000 mg | ORAL_TABLET | Freq: Every day | ORAL | Status: DC
  Administered 2022-01-23 – 2022-01-24 (×2): 400 mg via ORAL
  Filled 2022-01-23 (×2): qty 1

## 2022-01-23 MED ORDER — PSYLLIUM 95 % PO PACK
1.0000 | PACK | Freq: Two times a day (BID) | ORAL | Status: DC
  Administered 2022-01-23: 1 via ORAL
  Filled 2022-01-23 (×3): qty 1

## 2022-01-23 MED ORDER — PEG 3350-KCL-NA BICARB-NACL 420 G PO SOLR
4000.0000 mL | Freq: Once | ORAL | Status: AC
  Administered 2022-01-23: 4000 mL via ORAL

## 2022-01-23 NOTE — Progress Notes (Signed)
Patient refuses CBG monitoring.

## 2022-01-23 NOTE — Progress Notes (Signed)
Pt had no acute events during the night.

## 2022-01-23 NOTE — Progress Notes (Signed)
Patient had a bloody bowel movement when he was ready to be discharged.  Plan to hold discharge. Clear liquid diet and n.p.o. postmidnight. Colonic prep today Colonoscopy tomorrow  Discussed the same with the patient as well as his wife over the phone. The understanding verbalized consent.  Advised patient's nurse to provide a bedside commode.

## 2022-01-23 NOTE — Progress Notes (Signed)
PROGRESS NOTE    Aaron Harmon  DEY:[id number] DOB: 07/30/1933 DOA: 01/20/2022 PCP: Virgie Dad, MD   Brief Narrative: 88 year old male admitted with melena and blood in stool.  He was discharged today and was on his way out when he had a large bloody black BM.  Discharge was canceled.  GI recommended bleeding scan and possible IR consult if the scan is positive.    Assessment & Plan:   Principal Problem:   Acute GI bleeding Active Problems:   Pancytopenia ([name])   GI bleed   #1 GI bleed possible diverticular bleed-he had another large bloody bowel movement today though his hemoglobin have remained stable.  Appreciate GI input and possible colonoscopy tomorrow.   Nuclear medicine bleeding scan with no active bleeding 01/22/2022  His hemoglobin was 8 on admission he received 2 units of packed RBCs. On PPI twice daily while in hospital  #2 history of prostate cancer with prostatectomy  #3 history of mitral valve prolapse  #4 pancytopenia resolved   Estimated body mass index is 23.75 kg/m as calculated from the following:   Height as of this encounter: 6\' 1"  (1.854 m).   Weight as of this encounter: 81.6 kg.  DVT prophylaxis: none due to gi bleed Code Status: full Family Communication: Discussed with Dr. Patria Mane Disposition Plan:  Status is: Inpatient  Remains inpatient appropriate because: gi bleed   Consultants: gi  Procedures: none Antimicrobials none  Subjective:   Asking for something to eat when I saw him earlier he had not had any further bowel movements later in the afternoon patient had another large bloody bowel movement Objective: Vitals:   01/22/22 2015 01/23/22 0452 01/23/22 1310 01/23/22 1350  BP: (!) 159/78 (!) 146/82 (!) 128/59 130/76  Pulse: 63 62 74 75  Resp: 18 18 17    Temp: (!) 97.3 F (36.3 C) 97.7 F (36.5 C) 98.2 F (36.8 C)   TempSrc: Oral Oral Oral   SpO2:  96% 98%   Weight:      Height:        Intake/Output Summary  (Last 24 hours) at 01/23/2022 1419 Last data filed at 01/23/2022 1230 Gross per 24 hour  Intake 558 ml  Output --  Net 558 ml    Filed Weights   01/20/22 2015  Weight: 81.6 kg    Examination:  General exam: Appears calm and comfortable  Respiratory system: Clear to auscultation. Respiratory effort normal. Cardiovascular system: S1 & S2 heard, RRR. No JVD, murmurs, rubs, gallops or clicks. No pedal edema. Gastrointestinal system: Abdomen is nondistended, soft and nontender. No organomegaly or masses felt. Normal bowel sounds heard. Central nervous system: Alert and oriented. No focal neurological deficits. Extremities: Symmetric 5 x 5 power. Skin: No rashes, lesions or ulcers Psychiatry: Judgement and insight appear normal. Mood & affect appropriate.     Data Reviewed: I have personally reviewed following labs and imaging studies  CBC: Recent Labs  Lab 01/20/22 2041 01/21/22 1208 01/22/22 0433 01/23/22 0708  WBC 1.4* 7.6 7.2 6.6  NEUTROABS  --  5.8 4.5  --   HGB 8.1* 13.7 13.0 13.3  HCT 24.0* 39.7 38.7* 39.8  MCV 94.1 91.5 91.7 92.3  PLT 92* 174 169 314    Basic Metabolic Panel: Recent Labs  Lab 01/20/22 2041 01/21/22 1208 01/23/22 0708  NA 138 138 137  K 4.2 3.9 3.9  CL 108 106 106  CO2 24 25 25   GLUCOSE 129* 100* 86  BUN 29* 25*  17  CREATININE 1.12 0.89 1.00  CALCIUM 8.8* 8.4* 8.5*    GFR: Estimated Creatinine Clearance: 59.9 mL/min (by C-G formula based on SCr of 1 mg/dL). Liver Function Tests: Recent Labs  Lab 01/20/22 2041 01/23/22 0708  AST 18 19  ALT 16 14  ALKPHOS 59 58  BILITOT 0.8 1.0  PROT 6.7 6.1*  ALBUMIN 3.9 3.7    No results for input(s): LIPASE, AMYLASE in the last 168 hours. No results for input(s): AMMONIA in the last 168 hours. Coagulation Profile: Recent Labs  Lab 01/20/22 2041  INR 1.0    Cardiac Enzymes: No results for input(s): CKTOTAL, CKMB, CKMBINDEX, TROPONINI in the last 168 hours. BNP (last 3 results) No  results for input(s): PROBNP in the last 8760 hours. HbA1C: No results for input(s): HGBA1C in the last 72 hours. CBG: Recent Labs  Lab 01/21/22 1757 01/22/22 0029  GLUCAP 94 92    Lipid Profile: No results for input(s): CHOL, HDL, LDLCALC, TRIG, CHOLHDL, LDLDIRECT in the last 72 hours. Thyroid Function Tests: No results for input(s): TSH, T4TOTAL, FREET4, T3FREE, THYROIDAB in the last 72 hours. Anemia Panel: No results for input(s): VITAMINB12, FOLATE, FERRITIN, TIBC, IRON, RETICCTPCT in the last 72 hours. Sepsis Labs: No results for input(s): PROCALCITON, LATICACIDVEN in the last 168 hours.  Recent Results (from the past 240 hour(s))  Resp Panel by RT-PCR (Flu A&B, Covid) Nasopharyngeal Swab     Status: None   Collection Time: 01/20/22 11:29 PM   Specimen: Nasopharyngeal Swab; Nasopharyngeal(NP) swabs in vial transport medium  Result Value Ref Range Status   SARS Coronavirus 2 by RT PCR NEGATIVE NEGATIVE Final    Comment: (NOTE) SARS-CoV-2 target nucleic acids are NOT DETECTED.  The SARS-CoV-2 RNA is generally detectable in upper respiratory specimens during the acute phase of infection. The lowest concentration of SARS-CoV-2 viral copies this assay can detect is 138 copies/mL. A negative result does not preclude SARS-Cov-2 infection and should not be used as the sole basis for treatment or other patient management decisions. A negative result may occur with  improper specimen collection/handling, submission of specimen other than nasopharyngeal swab, presence of viral mutation(s) within the areas targeted by this assay, and inadequate number of viral copies(<138 copies/mL). A negative result must be combined with clinical observations, patient history, and epidemiological information. The expected result is Negative.  Fact Sheet for Patients:  EntrepreneurPulse.com.au  Fact Sheet for Healthcare Providers:   IncredibleEmployment.be  This test is no t yet approved or cleared by the [place] FDA and  has been authorized for detection and/or diagnosis of SARS-CoV-2 by FDA under an Emergency Use Authorization (EUA). This EUA will remain  in effect (meaning this test can be used) for the duration of the COVID-19 declaration under Section 564(b)(1) of the Act, 21 U.S.C.section 360bbb-3(b)(1), unless the authorization is terminated  or revoked sooner.       Influenza A by PCR NEGATIVE NEGATIVE Final   Influenza B by PCR NEGATIVE NEGATIVE Final    Comment: (NOTE) The Xpert Xpress SARS-CoV-2/FLU/RSV plus assay is intended as an aid in the diagnosis of influenza from Nasopharyngeal swab specimens and should not be used as a sole basis for treatment. Nasal washings and aspirates are unacceptable for Xpert Xpress SARS-CoV-2/FLU/RSV testing.  Fact Sheet for Patients: EntrepreneurPulse.com.au  Fact Sheet for Healthcare Providers: IncredibleEmployment.be  This test is not yet approved or cleared by the [place] FDA and has been authorized for detection and/or diagnosis of SARS-CoV-2 by FDA under  an Emergency Use Authorization (EUA). This EUA will remain in effect (meaning this test can be used) for the duration of the COVID-19 declaration under Section 564(b)(1) of the Act, 21 U.S.C. section 360bbb-3(b)(1), unless the authorization is terminated or revoked.  Performed at [facility], [place] [address], [place], [place] [address]           Radiology Studies: NM GI Blood Loss  Result Date: 01/22/2022 CLINICAL DATA:  Black tarry stools with bright red stool Tuesday EXAM: NUCLEAR MEDICINE GASTROINTESTINAL BLEEDING SCAN TECHNIQUE: Sequential abdominal images were obtained following intravenous administration of Tc-18m labeled red blood cells. RADIOPHARMACEUTICALS:  20 mCi Tc-106m pertechnetate in-vitro labeled  red cells. COMPARISON:  CTA abdomen and pelvis 01/19/2021 FINDINGS: Scattered motion artifacts. Normal blood pool distribution of labeled red cells. No abnormal gastrointestinal localization of tracer is seen through 2 hours of imaging to suggest active GI bleeding. IMPRESSION: Negative GI bleeding scan. Electronically Signed   By: Lavonia Dana M.D.   On: 01/22/2022 17:48        Scheduled Meds:  pantoprazole (PROTONIX) IV  40 mg Intravenous Q12H   pneumococcal 23 valent vaccine  0.5 mL Intramuscular Tomorrow-1000   polyethylene glycol-electrolytes  4,000 mL Oral Once   psyllium  1 packet Oral BID   Continuous Infusions:   LOS: 2 days     Georgette Shell, MD 01/23/2022, 2:19 PM

## 2022-01-23 NOTE — Progress Notes (Signed)
[facility] Gastroenterology Progress Note  Aaron Harmon 88 y.o. May 14, 1933  CC: Rectal bleeding   Subjective: Patient states he has had no further bloody bowel movements.  Denies abdominal pain.  Denies nausea/vomiting.  States he would like to go home today.  ROS : Review of Systems  Gastrointestinal:  Negative for abdominal pain, blood in stool, constipation, diarrhea, heartburn, melena, nausea and vomiting.  Genitourinary:  Negative for dysuria and urgency.     Objective: Vital signs in last 24 hours: Vitals:   01/22/22 2015 01/23/22 0452  BP: (!) 159/78 (!) 146/82  Pulse: 63 62  Resp: 18 18  Temp: (!) 97.3 F (36.3 C) 97.7 F (36.5 C)  SpO2:  96%    Physical Exam:  General:  Alert, cooperative, no distress, appears stated age  Head:  Normocephalic, without obvious abnormality, atraumatic  Eyes:  Anicteric sclera, EOM's intact  Lungs:   Clear to auscultation bilaterally, respirations unlabored  Heart:  Regular rate and rhythm, S1, S2 normal  Abdomen:   Soft, non-tender, bowel sounds active all four quadrants,  no masses,     Lab Results: Recent Labs    01/21/22 1208 01/23/22 0708  NA 138 137  K 3.9 3.9  CL 106 106  CO2 25 25  GLUCOSE 100* 86  BUN 25* 17  CREATININE 0.89 1.00  CALCIUM 8.4* 8.5*   Recent Labs    01/20/22 2041 01/23/22 0708  AST 18 19  ALT 16 14  ALKPHOS 59 58  BILITOT 0.8 1.0  PROT 6.7 6.1*  ALBUMIN 3.9 3.7   Recent Labs    01/21/22 1208 01/22/22 0433 01/23/22 0708  WBC 7.6 7.2 6.6  NEUTROABS 5.8 4.5  --   HGB 13.7 13.0 13.3  HCT 39.7 38.7* 39.8  MCV 91.5 91.7 92.3  PLT 174 169 186   Recent Labs    01/20/22 2041  LABPROT 13.6  INR 1.0      Assessment GI bleed; possibly diverticular bleed - CTA ab/pelvis 1/24: Negative for active bleed. Extensive colonic diverticulosis without diverticulitis.  -Hgb 13.3 -BUN 17, creatinine 1.00 -NM GI blood loss 1/26: Negative bleeding scan   Plan: Patient has had no further  episodes of bleeding, negative bleeding scan, stable hemoglobin and normal BUN. Patient can be discharged from a GI standpoint as long as there are no further episodes of bloody bowel movements.  Patient understands if he has a recurrence of rectal bleeding to return to ED.  Suspect patient's rectal bleeding could possibly have been due to diverticular bleed. Can advance diet as tolerated [facility] will sign off. Please contact us if we can be of any further assistance during this hospital stay.   Garnette Scheuermann PA-C 01/23/2022, 10:54 AM  Contact #  [phone]

## 2022-01-23 NOTE — H&P (View-Only) (Signed)
Patient had a bloody bowel movement when he was ready to be discharged.  Plan to hold discharge. Clear liquid diet and n.p.o. postmidnight. Colonic prep today Colonoscopy tomorrow  Discussed the same with the patient as well as his wife over the phone. The understanding verbalized consent.  Advised patient's nurse to provide a bedside commode.

## 2022-01-24 ENCOUNTER — Encounter (HOSPITAL_COMMUNITY): Payer: Self-pay | Admitting: Internal Medicine

## 2022-01-24 ENCOUNTER — Inpatient Hospital Stay (HOSPITAL_COMMUNITY): Payer: Medicare Other | Admitting: Anesthesiology

## 2022-01-24 ENCOUNTER — Encounter (HOSPITAL_COMMUNITY)
Admission: EM | Disposition: A | Discharge status: Home / Self Care | Payer: Self-pay | Source: Home / Self Care | Attending: Internal Medicine

## 2022-01-24 DIAGNOSIS — K922 Gastrointestinal hemorrhage, unspecified: Secondary | ICD-10-CM | POA: Diagnosis not present

## 2022-01-24 HISTORY — PX: BIOPSY: SHX5522

## 2022-01-24 HISTORY — PX: POLYPECTOMY: SHX5525

## 2022-01-24 HISTORY — PX: COLONOSCOPY WITH PROPOFOL: SHX5780

## 2022-01-24 HISTORY — PX: HEMOSTASIS CLIP PLACEMENT: SHX6857

## 2022-01-24 LAB — CBC
HCT: 40 % (ref 39.0–52.0)
Hemoglobin: 13.3 g/dL (ref 13.0–17.0)
MCH: 31.4 pg (ref 26.0–34.0)
MCHC: 33.3 g/dL (ref 30.0–36.0)
MCV: 94.3 fL (ref 80.0–100.0)
Platelets: 176 10*3/uL (ref 150–400)
RBC: 4.24 MIL/uL (ref 4.22–5.81)
RDW: 13.9 % (ref 11.5–15.5)
WBC: 6.8 10*3/uL (ref 4.0–10.5)
nRBC: 0 % (ref 0.0–0.2)

## 2022-01-24 SURGERY — COLONOSCOPY WITH PROPOFOL
Anesthesia: Monitor Anesthesia Care

## 2022-01-24 MED ORDER — PANTOPRAZOLE SODIUM 40 MG IV SOLR: 40.0000 mg | INTRAVENOUS | Status: DC

## 2022-01-24 MED ORDER — LACTATED RINGERS IV SOLN: INTRAVENOUS | Status: DC | PRN

## 2022-01-24 MED ORDER — EPHEDRINE SULFATE-NACL 50-0.9 MG/10ML-% IV SOSY
PREFILLED_SYRINGE | INTRAVENOUS | Status: DC | PRN
  Administered 2022-01-24: 10 mg via INTRAVENOUS

## 2022-01-24 MED ORDER — PROPOFOL 500 MG/50ML IV EMUL
INTRAVENOUS | Status: DC | PRN
  Administered 2022-01-24: 30 mg via INTRAVENOUS
  Administered 2022-01-24: 75 ug/kg/min via INTRAVENOUS

## 2022-01-24 SURGICAL SUPPLY — 22 items

## 2022-01-24 NOTE — Transfer of Care (Signed)
Immediate Anesthesia Transfer of Care Note  Patient: Aaron Harmon  Procedure(s) Performed: Procedure(s): COLONOSCOPY WITH PROPOFOL (N/A) POLYPECTOMY HEMOSTASIS CLIP PLACEMENT  Patient Location: PACU and Endoscopy Unit  Anesthesia Type:MAC  Level of Consciousness: awake, alert  and oriented  Airway & Oxygen Therapy: Patient Spontanous Breathing and Patient connected to nasal cannula oxygen  Post-op Assessment: Report given to RN and Post -op Vital signs reviewed and stable  Post vital signs: Reviewed and stable  Last Vitals:  Vitals:   01/23/22 2143 01/24/22 1042  BP: (!) 153/80 (!) 180/83  Pulse: 67 72  Resp: 18 15  Temp: 36.5 C (!) 36.1 C  SpO2: 92% 95%    Complications: No apparent anesthesia complications

## 2022-01-24 NOTE — Interval H&P Note (Signed)
History and Physical Interval Note: 88/male with hematochezia, family history of colon cancer in his mother in her 10s, no prior colonoscopy and extensive diverticulosis noted on CT for a colonoscopy with propofol today.  01/24/2022 10:51 AM  Aaron Harmon  has presented today for Colonoscopy, with the diagnosis of hematochezia.  The various methods of treatment have been discussed with the patient and family. After consideration of risks, benefits and other options for treatment, the patient has consented to  Procedure(s): COLONOSCOPY WITH PROPOFOL (N/A) as a surgical intervention.  The patient's history has been reviewed, patient examined, no change in status, stable for surgery.  I have reviewed the patient's chart and labs.  Questions were answered to the patient's satisfaction.     Ronnette Juniper

## 2022-01-24 NOTE — Anesthesia Postprocedure Evaluation (Signed)
Anesthesia Post Note  Patient: Aaron Harmon  Procedure(s) Performed: COLONOSCOPY WITH PROPOFOL POLYPECTOMY HEMOSTASIS CLIP PLACEMENT     Patient location during evaluation: PACU Anesthesia Type: MAC Level of consciousness: awake and alert Pain management: pain level controlled Vital Signs Assessment: post-procedure vital signs reviewed and stable Respiratory status: spontaneous breathing, nonlabored ventilation, respiratory function stable and patient connected to nasal cannula oxygen Cardiovascular status: stable and blood pressure returned to baseline Postop Assessment: no apparent nausea or vomiting Anesthetic complications: no   No notable events documented.  Last Vitals:  Vitals:   01/24/22 1150 01/24/22 1243  BP: 133/70 (!) 156/82  Pulse: 67 77  Resp: 11 20  Temp:  36.4 C  SpO2: 99% 100%    Last Pain:  Vitals:   01/24/22 1243  TempSrc: Oral  PainSc:                  [name]

## 2022-01-24 NOTE — Plan of Care (Signed)
Pt continues to have brownish-yellow bowel movements. NPO since MN. Problem: Elimination: Goal: Will not experience complications related to bowel motility Outcome: Progressing Goal: Will not experience complications related to urinary retention Outcome: Progressing   Problem: Safety: Goal: Ability to remain free from injury will improve Outcome: Progressing

## 2022-01-24 NOTE — Anesthesia Preprocedure Evaluation (Addendum)
Anesthesia Evaluation  Patient identified by MRN, date of birth, ID band Patient awake    Reviewed: Allergy & Precautions, H&P , NPO status , Patient's Chart, lab work & pertinent test results, reviewed documented beta blocker date and time   Airway Mallampati: II  TM Distance: >3 FB Neck ROM: full    Dental no notable dental hx. (+) Teeth Intact, Missing, Implants, Partial Lower, Dental Advisory Given   Pulmonary neg pulmonary ROS,    Pulmonary exam normal breath sounds clear to auscultation       Cardiovascular Exercise Tolerance: Good negative cardio ROS   Rhythm:regular Rate:Normal     Neuro/Psych negative neurological ROS  negative psych ROS   GI/Hepatic negative GI ROS, Neg liver ROS,   Endo/Other  negative endocrine ROS  Renal/GU negative Renal ROS  negative genitourinary   Musculoskeletal   Abdominal   Peds  Hematology  (+) Blood dyscrasia, anemia ,   Anesthesia Other Findings   Reproductive/Obstetrics negative OB ROS                            Anesthesia Physical Anesthesia Plan  ASA: 3 and emergent  Anesthesia Plan: MAC   Post-op Pain Management: Minimal or no pain anticipated   Induction: Intravenous  PONV Risk Score and Plan: Propofol infusion  Airway Management Planned: Mask, Natural Airway, Nasal Cannula and Simple Face Mask  Additional Equipment: None  Intra-op Plan:   Post-operative Plan:   Informed Consent: I have reviewed the patients History and Physical, chart, labs and discussed the procedure including the risks, benefits and alternatives for the proposed anesthesia with the patient or authorized representative who has indicated his/her understanding and acceptance.    Discussed DNR with power of attorney and Continue DNR.   Dental Advisory Given  Plan Discussed with: CRNA and Anesthesiologist  Anesthesia Plan Comments: (HPI: Aaron Harmon is a 88  y.o. male with history of mitral valve prolapse, prostate cancer status post prostatectomy presents to the ER after patient had a large bloody bowel movement. )       Anesthesia Quick Evaluation

## 2022-01-24 NOTE — Discharge Summary (Signed)
Physician Discharge Summary  Aaron Harmon ZES:[id number] DOB: Dec 13, 1933 DOA: 01/20/2022  PCP: Virgie Dad, MD  Admit date: 01/20/2022 Discharge date: 01/24/2022  Admitted From: [facility] Disposition:  [place]  Recommendations for Outpatient Follow-up:  Follow up with PCP in 1-2 weeks Please obtain BMP/CBC in one week Please follow up on the pathology results of polyps  Home Health:none Equipment/Devices:none  Discharge Condition:stable CODE STATUS:full  Diet recommendation: high fiber cardiac Brief/Interim Summary: 88 year old male admitted with melena and blood in stool.  He was discharged today and was on his way out when he had a large bloody black BM.  Discharge was canceled.  GI recommended bleeding scan and possible IR consult if the scan is positive.    Discharge Diagnoses:  Principal Problem:   Acute GI bleeding Active Problems:   Pancytopenia ([place])   GI bleed    #1 GI bleed patient had a colonoscopy done on 01/24/2022 by Dr. Therisa Doyne with findings of 4 polyps varying in size from 4 mm to 7 mm internal hemorrhoids and diverticulosis.  Yellow stool noted throughout the colon.  No active bleeding noted.  Patient's hemoglobin remained stable since her initial transfusion of 2 units when he was admitted.   Nuclear medicine bleeding scan with no active bleeding 01/22/2022  His hemoglobin was 8 on admission he received 2 units of packed RBCs.  On the day of discharge his hemoglobin was 14.   #2 history of prostate cancer with prostatectomy   #3 history of mitral valve prolapse   #4 pancytopenia resolved   Estimated body mass index is 23.75 kg/m as calculated from the following:   Height as of this encounter: 6\' 1"  (1.854 m).   Weight as of this encounter: 81.6 kg.  Discharge Instructions  Discharge Instructions     Diet - low sodium heart healthy   Complete by: As directed    Diet - low sodium heart healthy   Complete by: As directed    Increase activity slowly   Complete  by: As directed    Increase activity slowly   Complete by: As directed       Allergies as of 01/24/2022       Reactions   Ciprofloxacin         Medication List     STOP taking these medications    acyclovir 800 MG tablet Commonly known as: ZOVIRAX   sulfamethoxazole-trimethoprim 800-160 MG tablet Commonly known as: BACTRIM DS       TAKE these medications    MAGNESIUM CITRATE PO Take 500 mg by mouth daily.   psyllium 58.6 % powder Commonly known as: METAMUCIL Take 1 packet by mouth daily.        Follow-up Information     Virgie Dad, MD Follow up.   Specialty: Internal Medicine Contact information: [place] [address] [phone]                Allergies  Allergen Reactions   Ciprofloxacin     Consultations: GI Dr. Therisa Doyne   Procedures/Studies: NM GI Blood Loss  Result Date: 01/22/2022 CLINICAL DATA:  Black tarry stools with bright red stool Tuesday EXAM: NUCLEAR MEDICINE GASTROINTESTINAL BLEEDING SCAN TECHNIQUE: Sequential abdominal images were obtained following intravenous administration of Tc-51m labeled red blood cells. RADIOPHARMACEUTICALS:  20 mCi Tc-24m pertechnetate in-vitro labeled red cells. COMPARISON:  CTA abdomen and pelvis 01/19/2021 FINDINGS: Scattered motion artifacts. Normal blood pool distribution of labeled red cells. No abnormal gastrointestinal localization of tracer  is seen through 2 hours of imaging to suggest active GI bleeding. IMPRESSION: Negative GI bleeding scan. Electronically Signed   By: Lavonia Dana M.D.   On: 01/22/2022 17:48   CT Angio Abd/Pel W and/or Wo Contrast  Result Date: 01/20/2022 CLINICAL DATA:  GI bleed, lower.  Bright red blood EXAM: CTA ABDOMEN AND PELVIS WITHOUT AND WITH CONTRAST TECHNIQUE: Multidetector CT imaging of the abdomen and pelvis was performed using the standard protocol during bolus administration of intravenous contrast. Multiplanar reconstructed images and MIPs  were obtained and reviewed to evaluate the vascular anatomy. RADIATION DOSE REDUCTION: This exam was performed according to the departmental dose-optimization program which includes automated exposure control, adjustment of the mA and/or kV according to patient size and/or use of iterative reconstruction technique. CONTRAST:  139mL OMNIPAQUE IOHEXOL 350 MG/ML SOLN COMPARISON:  None. FINDINGS: VASCULAR Aorta: Aortic atherosclerosis.  No aneurysm or dissection. Celiac: Patent. Mild stenosis proximally likely related to median arcuate ligament. SMA: Patent. Renals: Patent bilaterally. Small left lower pole accessory renal artery. IMA: Patent Inflow: Atherosclerotic disease, patent. Proximal Outflow: Mild atherosclerotic disease, patent. Veins: No obvious venous abnormality within the limitations of this arterial phase study. Review of the MIP images confirms the above findings. NON-VASCULAR Lower chest: No acute abnormality. Hepatobiliary: No focal hepatic abnormality. Gallbladder unremarkable. Pancreas: No focal abnormality or ductal dilatation. Spleen: Normal size. 2 cm lower density lesion in the upper pole of the spleen, likely cyst or hemangioma. Adrenals/Urinary Tract: Normal adrenal glands. Bilateral renal cysts. 16 mm stone layering in the left renal pelvis. Mild fullness of the left renal pelvis and collecting system. Ureters are decompressed. This may be related to chronic UPJ obstruction. Urinary bladder unremarkable. Stomach/Bowel: Diffuse colonic diverticulosis, most pronounced in the left colon. No active diverticulitis. No visible active contrast extravasation to localize GI bleed. Stomach and small bowel decompressed, unremarkable. Lymphatic: No adenopathy Reproductive: Prostate calcifications. Other: No free fluid or free air. Musculoskeletal: No acute bony abnormality. IMPRESSION: VASCULAR Diffuse aortic atherosclerosis. No evidence of aneurysm or dissection. No contrast extravasation within the colon  to localize GI bleed. NON-VASCULAR Extensive colonic diverticulosis.  No active diverticulitis. 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 16 mm nonobstructing stone layering dependently in the left renal pelvis. Bilateral renal cysts. Electronically Signed   By: Rolm Baptise M.D.   On: 01/20/2022 23:14   (Echo, Carotid, EGD, Colonoscopy, ERCP)    Subjective:  He is resting in bed I saw him prior to colonoscopy he was anxious to have the procedure and discharged no further bowel movements reported. Discharge Exam: Vitals:   01/24/22 1134 01/24/22 1141  BP: (!) 113/52 (!) 114/57  Pulse: 70 68  Resp: 16 18  Temp: 97.7 F (36.5 C)   SpO2: 100% 100%   Vitals:   01/23/22 2143 01/24/22 1042 01/24/22 1134 01/24/22 1141  BP: (!) 153/80 (!) 180/83 (!) 113/52 (!) 114/57  Pulse: 67 72 70 68  Resp: 18 15 16 18   Temp: 97.7 F (36.5 C) (!) 96.9 F (36.1 C) 97.7 F (36.5 C)   TempSrc: Oral Temporal Oral   SpO2: 99% 98% 100% 100%  Weight:      Height:        General: Pt is alert, awake, not in acute distress Cardiovascular: RRR, S1/S2 +, no rubs, no gallops Respiratory: CTA bilaterally, no wheezing, no rhonchi Abdominal: Soft, NT, ND, bowel sounds + Extremities: no edema, no cyanosis    The results of  significant diagnostics from this hospitalization (including imaging, microbiology, ancillary and laboratory) are listed below for reference.     Microbiology: Recent Results (from the past 240 hour(s))  Resp Panel by RT-PCR (Flu A&B, Covid) Nasopharyngeal Swab     Status: None   Collection Time: 01/20/22 11:29 PM   Specimen: Nasopharyngeal Swab; Nasopharyngeal(NP) swabs in vial transport medium  Result Value Ref Range Status   SARS Coronavirus 2 by RT PCR NEGATIVE NEGATIVE Final    Comment: (NOTE) SARS-CoV-2 target nucleic acids are NOT DETECTED.  The SARS-CoV-2 RNA is generally detectable in upper respiratory specimens during the  acute phase of infection. The lowest concentration of SARS-CoV-2 viral copies this assay can detect is 138 copies/mL. A negative result does not preclude SARS-Cov-2 infection and should not be used as the sole basis for treatment or other patient management decisions. A negative result may occur with  improper specimen collection/handling, submission of specimen other than nasopharyngeal swab, presence of viral mutation(s) within the areas targeted by this assay, and inadequate number of viral copies(<138 copies/mL). A negative result must be combined with clinical observations, patient history, and epidemiological information. The expected result is Negative.  Fact Sheet for Patients:  EntrepreneurPulse.com.au  Fact Sheet for Healthcare Providers:  IncredibleEmployment.be  This test is no t yet approved or cleared by the [place] FDA and  has been authorized for detection and/or diagnosis of SARS-CoV-2 by FDA under an Emergency Use Authorization (EUA). This EUA will remain  in effect (meaning this test can be used) for the duration of the COVID-19 declaration under Section 564(b)(1) of the Act, 21 U.S.C.section 360bbb-3(b)(1), unless the authorization is terminated  or revoked sooner.       Influenza A by PCR NEGATIVE NEGATIVE Final   Influenza B by PCR NEGATIVE NEGATIVE Final    Comment: (NOTE) The Xpert Xpress SARS-CoV-2/FLU/RSV plus assay is intended as an aid in the diagnosis of influenza from Nasopharyngeal swab specimens and should not be used as a sole basis for treatment. Nasal washings and aspirates are unacceptable for Xpert Xpress SARS-CoV-2/FLU/RSV testing.  Fact Sheet for Patients: EntrepreneurPulse.com.au  Fact Sheet for Healthcare Providers: IncredibleEmployment.be  This test is not yet approved or cleared by the [place] FDA and has been authorized for detection and/or  diagnosis of SARS-CoV-2 by FDA under an Emergency Use Authorization (EUA). This EUA will remain in effect (meaning this test can be used) for the duration of the COVID-19 declaration under Section 564(b)(1) of the Act, 21 U.S.C. section 360bbb-3(b)(1), unless the authorization is terminated or revoked.  Performed at [facility], [place] [address] [place], [place] [address]      Labs: BNP (last 3 results) No results for input(s): BNP in the last 8760 hours. Basic Metabolic Panel: Recent Labs  Lab 01/20/22 2041 01/21/22 1208 01/23/22 0708  NA 138 138 137  K 4.2 3.9 3.9  CL 108 106 106  CO2 24 25 25   GLUCOSE 129* 100* 86  BUN 29* 25* 17  CREATININE 1.12 0.89 1.00  CALCIUM 8.8* 8.4* 8.5*   Liver Function Tests: Recent Labs  Lab 01/20/22 2041 01/23/22 0708  AST 18 19  ALT 16 14  ALKPHOS 59 58  BILITOT 0.8 1.0  PROT 6.7 6.1*  ALBUMIN 3.9 3.7   No results for input(s): LIPASE, AMYLASE in the last 168 hours. No results for input(s): AMMONIA in the last 168 hours. CBC: Recent Labs  Lab 01/21/22 1208 01/22/22 0433 01/23/22 0708 01/23/22 1827 01/24/22  0555  WBC 7.6 7.2 6.6 8.9 6.8  NEUTROABS 5.8 4.5  --   --   --   HGB 13.7 13.0 13.3 14.6 13.3  HCT 39.7 38.7* 39.8 44.9 40.0  MCV 91.5 91.7 92.3 93.9 94.3  PLT 174 169 186 191 176   Cardiac Enzymes: No results for input(s): CKTOTAL, CKMB, CKMBINDEX, TROPONINI in the last 168 hours. BNP: Invalid input(s): POCBNP CBG: Recent Labs  Lab 01/21/22 1757 01/22/22 0029  GLUCAP 94 92   D-Dimer No results for input(s): DDIMER in the last 72 hours. Hgb A1c No results for input(s): HGBA1C in the last 72 hours. Lipid Profile No results for input(s): CHOL, HDL, LDLCALC, TRIG, CHOLHDL, LDLDIRECT in the last 72 hours. Thyroid function studies No results for input(s): TSH, T4TOTAL, T3FREE, THYROIDAB in the last 72 hours.  Invalid input(s): FREET3 Anemia work up No results for input(s): VITAMINB12,  FOLATE, FERRITIN, TIBC, IRON, RETICCTPCT in the last 72 hours. Urinalysis No results found for: COLORURINE, APPEARANCEUR, [place], [place], GLUCOSEU, [place], [place], [place], PROTEINUR, UROBILINOGEN, NITRITE, LEUKOCYTESUR Sepsis Labs Invalid input(s): PROCALCITONIN,  WBC,  LACTICIDVEN Microbiology Recent Results (from the past 240 hour(s))  Resp Panel by RT-PCR (Flu A&B, Covid) Nasopharyngeal Swab     Status: None   Collection Time: 01/20/22 11:29 PM   Specimen: Nasopharyngeal Swab; Nasopharyngeal(NP) swabs in vial transport medium  Result Value Ref Range Status   SARS Coronavirus 2 by RT PCR NEGATIVE NEGATIVE Final    Comment: (NOTE) SARS-CoV-2 target nucleic acids are NOT DETECTED.  The SARS-CoV-2 RNA is generally detectable in upper respiratory specimens during the acute phase of infection. The lowest concentration of SARS-CoV-2 viral copies this assay can detect is 138 copies/mL. A negative result does not preclude SARS-Cov-2 infection and should not be used as the sole basis for treatment or other patient management decisions. A negative result may occur with  improper specimen collection/handling, submission of specimen other than nasopharyngeal swab, presence of viral mutation(s) within the areas targeted by this assay, and inadequate number of viral copies(<138 copies/mL). A negative result must be combined with clinical observations, patient history, and epidemiological information. The expected result is Negative.  Fact Sheet for Patients:  EntrepreneurPulse.com.au  Fact Sheet for Healthcare Providers:  IncredibleEmployment.be  This test is no t yet approved or cleared by the [place] FDA and  has been authorized for detection and/or diagnosis of SARS-CoV-2 by FDA under an Emergency Use Authorization (EUA). This EUA will remain  in effect (meaning this test can be used) for the duration of the COVID-19 declaration under  Section 564(b)(1) of the Act, 21 U.S.C.section 360bbb-3(b)(1), unless the authorization is terminated  or revoked sooner.       Influenza A by PCR NEGATIVE NEGATIVE Final   Influenza B by PCR NEGATIVE NEGATIVE Final    Comment: (NOTE) The Xpert Xpress SARS-CoV-2/FLU/RSV plus assay is intended as an aid in the diagnosis of influenza from Nasopharyngeal swab specimens and should not be used as a sole basis for treatment. Nasal washings and aspirates are unacceptable for Xpert Xpress SARS-CoV-2/FLU/RSV testing.  Fact Sheet for Patients: EntrepreneurPulse.com.au  Fact Sheet for Healthcare Providers: IncredibleEmployment.be  This test is not yet approved or cleared by the [place] FDA and has been authorized for detection and/or diagnosis of SARS-CoV-2 by FDA under an Emergency Use Authorization (EUA). This EUA will remain in effect (meaning this test can be used) for the duration of the COVID-19 declaration under Section 564(b)(1) of the Act, 21 U.S.C. section  360bbb-3(b)(1), unless the authorization is terminated or revoked.  Performed at [facility], [place] [address], [place], [place] [address]      Time coordinating discharge: 39 minutes  SIGNED: Georgette Shell, MD  [facility] 01/24/2022, 11:45 AM

## 2022-01-24 NOTE — Plan of Care (Signed)
Problem: Elimination: Goal: Will not experience complications related to bowel motility Outcome: Completed/Met Goal: Will not experience complications related to urinary retention Outcome: Completed/Met   Problem: Safety: Goal: Ability to remain free from injury will improve Outcome: Completed/Met

## 2022-01-24 NOTE — Op Note (Signed)
[facility] Patient Name: Aaron Harmon Procedure Date: 01/24/2022 MRN: [id number] Attending MD: Ronnette Juniper , MD Date of Birth: 11/20/1933 CSN: [id number] Age: 88 Admit Type: Inpatient Procedure:                Colonoscopy Indications:              This is the patient's first colonoscopy,                            Hematochezia, family history of colon cancer in his                            mother in her 66s Providers:                Ronnette Juniper, MD, Doristine Johns, RN, Princess Bruins,                            RN, Frazier Richards, Technician Referring MD:             [facility] Medicines:                Monitored Anesthesia Care Complications:            No immediate complications. Estimated Blood Loss:     Estimated blood loss was minimal. Procedure:                Pre-Anesthesia Assessment:                           - Prior to the procedure, a History and Physical                            was performed, and patient medications and                            allergies were reviewed. The patient's tolerance of                            previous anesthesia was also reviewed. The risks                            and benefits of the procedure and the sedation                            options and risks were discussed with the patient.                            All questions were answered, and informed consent                            was obtained. Prior Anticoagulants: The patient has                            taken no previous anticoagulant or antiplatelet  agents. ASA Grade Assessment: III - A patient with                            severe systemic disease. After reviewing the risks                            and benefits, the patient was deemed in                            satisfactory condition to undergo the procedure.                           After obtaining informed consent, the colonoscope                            was  passed under direct vision. Throughout the                            procedure, the patient's blood pressure, pulse, and                            oxygen saturations were monitored continuously. The                            [id number]-HQ190L ([id number] Olympus colonoscope was                            introduced through the anus and advanced to the the                            terminal ileum. The colonoscopy was performed                            without difficulty. The patient tolerated the                            procedure well. The quality of the bowel                            preparation was fair. Scope In: 11:03:31 AM Scope Out: 11:26:36 AM Scope Withdrawal Time: 0 hours 20 minutes 4 seconds  Total Procedure Duration: 0 hours 23 minutes 5 seconds  Findings:      Hemorrhoids were found on perianal exam.      A moderate amount of liquid stool was found in the entire colon, making       visualization difficult. Lavage of the area was performed, resulting in       clearance with fair visualization.      The terminal ileum appeared normal.      A 7 mm polyp was found in the cecum. The polyp was sessile. The polyp       was removed with a hot snare. Resection and retrieval were complete. To       close a defect after polypectomy, one hemostatic clip was successfully       placed (MR conditional). There  was no bleeding at the end of the       procedure.      A 4 mm polyp was found in the ascending colon. The polyp was sessile.       The polyp was removed with a cold biopsy forceps. Resection and       retrieval were complete.      Two sessile polyps were found in the transverse colon. The polyps were 7       to 8 mm in size. These polyps were removed with a hot snare. Resection       and retrieval were complete.      Multiple small and large-mouthed diverticula were found in the sigmoid       colon, descending colon and transverse colon.      Non-bleeding internal hemorrhoids were  found during retroflexion.      Anal papilla(e) were hypertrophied.      - Yellow stool was noted and there was no evidence of active or recent       bleeding in the colon or terminal ileum. Impression:               - Preparation of the colon was fair.                           - Hemorrhoids found on perianal exam.                           - Stool in the entire examined colon.                           - The examined portion of the ileum was normal.                           - One 7 mm polyp in the cecum, removed with a hot                            snare. Resected and retrieved. Clip (MR                            conditional) was placed.                           - One 4 mm polyp in the ascending colon, removed                            with a cold biopsy forceps. Resected and retrieved.                           - Two 7 to 8 mm polyps in the transverse colon,                            removed with a hot snare. Resected and retrieved.                           - Diverticulosis in the sigmoid colon, in the  descending colon and in the transverse colon.                           - Non-bleeding internal hemorrhoids.                           - Anal papilla(e) were hypertrophied. Moderate Sedation:      Patient did not receive moderate sedation for this procedure, but       instead received monitored anesthesia care. Recommendation:           - High fiber diet.                           - Fiber supplementation-metamcucil/benefiber daily.                           - Await pathology results.                           - Repeat colonoscopy is not recommended due to age. Procedure Code(s):        --- Professional ---                           [phone], Colonoscopy, flexible; with removal of                            tumor(s), polyp(s), or other lesion(s) by snare                            technique                           45380, 50, Colonoscopy, flexible; with  biopsy,                            single or multiple Diagnosis Code(s):        --- Professional ---                           K64.8, Other hemorrhoids                           K63.5, Polyp of colon                           K62.89, Other specified diseases of anus and rectum                           K92.1, Melena (includes Hematochezia)                           K57.30, Diverticulosis of large intestine without                            perforation or abscess without bleeding CPT copyright 2019 American Medical Association. All rights reserved. The codes documented in this report are preliminary and upon coder review may  be revised to  meet current compliance requirements. Ronnette Juniper, MD 01/24/2022 11:34:53 AM This report has been signed electronically. Number of Addenda: 0

## 2022-01-24 NOTE — Progress Notes (Signed)
D/C instructions reviewed w/ pt. Pt verbalizes understanding and all questions answered. Pt d/c in w/c in stable condition to family's car. Pt in possession of d/c packet and all personal belongings. Pt ate full bowl of grits for lunch post colonoscopy and tolerated well.

## 2022-01-26 ENCOUNTER — Encounter (HOSPITAL_COMMUNITY): Payer: Self-pay | Admitting: Gastroenterology

## 2022-01-26 ENCOUNTER — Telehealth: Payer: Self-pay | Admitting: *Deleted

## 2022-01-26 NOTE — Telephone Encounter (Signed)
Transition Care Management Follow-up Telephone Call Date of discharge and from where: 01/24/2022 [place] How have you been since you were released from the hospital? better Any questions or concerns? Yes  Items Reviewed: Did the pt receive and understand the discharge instructions provided? Yes  Medications obtained and verified? Yes  Other? No  Any new allergies since your discharge? No  Dietary orders reviewed? Yes Do you have support at home? Yes   Home Care and Equipment/Supplies: Were home health services ordered? yes If so, what is the name of the agency? Patient not sure but states he doesn't need Home Health  Has the agency set up a time to come to the patient's home? yes Were any new equipment or medical supplies ordered?  No What is the name of the medical supply agency? [facility] Were you able to get the supplies/equipment? not applicable Do you have any questions related to the use of the equipment or supplies? No  Functional Questionnaire: (I = Independent and D = Dependent) ADLs: I  Bathing/Dressing- I  Meal Prep- D  Eating- I  Maintaining continence- I  Transferring/Ambulation- I  Managing Meds- I  Follow up appointments reviewed:  PCP Hospital f/u appt confirmed? Yes  Scheduled to see Dr. Lyndel Safe on 01/28/2022 @ 10. [facility] f/u appt confirmed? No   Are transportation arrangements needed? No  If their condition worsens, is the pt aware to call PCP or go to the Emergency Dept.? Yes Was the patient provided with contact information for the PCP's office or ED? Yes Was to pt encouraged to call back with questions or concerns? Yes

## 2022-01-27 ENCOUNTER — Encounter: Payer: Self-pay | Admitting: Internal Medicine

## 2022-01-27 LAB — SURGICAL PATHOLOGY

## 2022-01-28 ENCOUNTER — Ambulatory Visit: Payer: Medicare Other | Admitting: Internal Medicine

## 2022-01-28 ENCOUNTER — Encounter: Payer: Self-pay | Admitting: Internal Medicine

## 2022-01-28 ENCOUNTER — Other Ambulatory Visit: Payer: Self-pay

## 2022-01-28 VITALS — BP 142/94 | HR 64 | Temp 97.5°F | Ht 73.0 in | Wt 180.4 lb

## 2022-01-28 DIAGNOSIS — I341 Nonrheumatic mitral (valve) prolapse: Secondary | ICD-10-CM | POA: Diagnosis not present

## 2022-01-28 DIAGNOSIS — I10 Essential (primary) hypertension: Secondary | ICD-10-CM | POA: Diagnosis not present

## 2022-01-28 DIAGNOSIS — R269 Unspecified abnormalities of gait and mobility: Secondary | ICD-10-CM

## 2022-01-28 DIAGNOSIS — K922 Gastrointestinal hemorrhage, unspecified: Secondary | ICD-10-CM

## 2022-01-28 DIAGNOSIS — C61 Malignant neoplasm of prostate: Secondary | ICD-10-CM
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
## 2022-03-26 ENCOUNTER — Telehealth: Payer: Self-pay | Admitting: *Deleted

## 2022-03-26 ENCOUNTER — Emergency Department (HOSPITAL_BASED_OUTPATIENT_CLINIC_OR_DEPARTMENT_OTHER)
Admission: EM | Admit: 2022-03-26 | Discharge: 2022-03-26 | Disposition: A | Discharge status: Home / Self Care | Payer: Medicare Other | Attending: Emergency Medicine | Admitting: Emergency Medicine

## 2022-03-26 ENCOUNTER — Other Ambulatory Visit: Payer: Self-pay

## 2022-03-26 ENCOUNTER — Encounter (HOSPITAL_BASED_OUTPATIENT_CLINIC_OR_DEPARTMENT_OTHER): Payer: Self-pay | Admitting: Emergency Medicine

## 2022-03-26 ENCOUNTER — Emergency Department (HOSPITAL_BASED_OUTPATIENT_CLINIC_OR_DEPARTMENT_OTHER): Payer: Medicare Other

## 2022-03-26 DIAGNOSIS — D649 Anemia, unspecified: Secondary | ICD-10-CM | POA: Diagnosis not present

## 2022-03-26 DIAGNOSIS — Y92231 Patient bathroom in hospital as the place of occurrence of the external cause: Secondary | ICD-10-CM | POA: Insufficient documentation

## 2022-03-26 DIAGNOSIS — S0101XA Laceration without foreign body of scalp, initial encounter: Secondary | ICD-10-CM | POA: Diagnosis not present

## 2022-03-26 DIAGNOSIS — R55 Syncope and collapse: Secondary | ICD-10-CM

## 2022-03-26 DIAGNOSIS — R7989 Other specified abnormal findings of blood chemistry: Secondary | ICD-10-CM | POA: Diagnosis not present

## 2022-03-26 DIAGNOSIS — R531 Weakness: Secondary | ICD-10-CM | POA: Insufficient documentation

## 2022-03-26 DIAGNOSIS — W01198A Fall on same level from slipping, tripping and stumbling with subsequent striking against other object, initial encounter: Secondary | ICD-10-CM | POA: Insufficient documentation

## 2022-03-26 LAB — COMPREHENSIVE METABOLIC PANEL
ALT: 12 U/L (ref 0–44)
AST: 12 U/L — ABNORMAL LOW (ref 15–41)
Albumin: 3.8 g/dL (ref 3.5–5.0)
Alkaline Phosphatase: 53 U/L (ref 38–126)
Anion gap: 7 (ref 5–15)
BUN: 25 mg/dL — ABNORMAL HIGH (ref 8–23)
CO2: 27 mmol/L (ref 22–32)
Calcium: 9 mg/dL (ref 8.9–10.3)
Chloride: 107 mmol/L (ref 98–111)
Creatinine, Ser: 1.29 mg/dL — ABNORMAL HIGH (ref 0.61–1.24)
GFR, Estimated: 54 mL/min — ABNORMAL LOW (ref >60)
Glucose, Bld: 95 mg/dL (ref 70–99)
Potassium: 4 mmol/L (ref 3.5–5.1)
Sodium: 141 mmol/L (ref 135–145)
Total Bilirubin: 0.5 mg/dL (ref 0.3–1.2)
Total Protein: 6.7 g/dL (ref 6.5–8.1)

## 2022-03-26 LAB — CBC
HCT: 37.8 % — ABNORMAL LOW (ref 39.0–52.0)
Hemoglobin: 12.2 g/dL — ABNORMAL LOW (ref 13.0–17.0)
MCH: 30.6 pg (ref 26.0–34.0)
MCHC: 32.3 g/dL (ref 30.0–36.0)
MCV: 94.7 fL (ref 80.0–100.0)
Platelets: 181 10*3/uL (ref 150–400)
RBC: 3.99 MIL/uL — ABNORMAL LOW (ref 4.22–5.81)
RDW: 13.6 % (ref 11.5–15.5)
WBC: 7.9 10*3/uL (ref 4.0–10.5)
nRBC: 0 % (ref 0.0–0.2)

## 2022-03-26 LAB — URINALYSIS, ROUTINE W REFLEX MICROSCOPIC
Bilirubin Urine: NEGATIVE
Glucose, UA: NEGATIVE mg/dL
Ketones, ur: NEGATIVE mg/dL
Leukocytes,Ua: NEGATIVE
Nitrite: NEGATIVE
Specific Gravity, Urine: 1.024 (ref 1.005–1.030)
pH: 5.5 (ref 5.0–8.0)

## 2022-03-26 NOTE — ED Triage Notes (Signed)
Pt arrives to ED with c/o fall and loss of consciousness. Pt reports he had passed out x2 days ago where he hit the posterior aspect of his head and lost consciousness after falling. He reports he woke up in a puddle of blood.  ?

## 2022-03-26 NOTE — Telephone Encounter (Signed)
Patient's wife, [name] called and stated that patient fell on Tuesday. He was evaluated by the [facility] nurses due to alittle cut on the back of his head.  ?Wife stated that patient passed out and hit the floor.  ?Wife is wondering if he should be evaluated and have a brain scan. Wanted an appointment at [facility] but no available appointment till Wednesday. Requesting to be seen today by [facility].  ? ?Please Advise.  ?

## 2022-03-26 NOTE — Telephone Encounter (Signed)
I would be happy to see him but if if he passed out and fell and hit his head he probably does need a CT scan of the head. If I order this outpatient it could take 1-2 weeks to get done. The most best and most efficient way would be for him to go to the ER for evaluation. Could need cardiac testing too due to possible syncope.  ? ?Thanks ?

## 2022-03-26 NOTE — ED Notes (Signed)
Patient verbalizes understanding of discharge instructions. Opportunity for questioning and answers were provided. Patient discharged from ED.  °

## 2022-03-26 NOTE — Discharge Instructions (Signed)
Your creatinine was slightly elevated and your hemoglobin is slightly low.  Follow-up with Dr. Lyndel Safe as planned.  She can see the lab work and notes from today

## 2022-03-26 NOTE — ED Provider Notes (Signed)
?[facility] ?Provider Note ? ? ?CSN: [id number] ?Arrival date & time: 03/26/22  1426 ? ?  ? ?History ? ?Chief Complaint  ?Patient presents with  ? Fall  ? Loss of Consciousness  ? ? ?Aaron Harmon is a 88 y.o. male. ? ? ?Fall ?Pertinent negatives include no chest pain, no abdominal pain and no shortness of breath.  ?Loss of Consciousness ?Associated symptoms: no chest pain and no shortness of breath   ?Patient presents with fall generalized weakness.  2 days ago fell in the middle the night.  States he got up to go to the bathroom.  States he urinated and have a bowel movement.  Woke up on the floor an hour later.  Had laceration the back of his head.  Has felt a little weak since but basically back to normal.  Had actually been doing better.  Had been feeling weak overall but then was doing better a day or 2 before.  No neck pain.  No further lightheadedness.  States he does feel generally weak at times.  Sometimes will feel as if his leg gives out.  No confusion.  Not on blood thinners.  Came in at this point because his family made him.  States he is still a little nervous to [place] ?  ? ?Home Medications ?Prior to Admission medications   ?Medication Sig Start Date End Date Taking? Authorizing Provider  ?MAGNESIUM CITRATE PO Take 500 mg by mouth daily.    [provider]  ?psyllium (METAMUCIL) 58.6 % powder Take 1 packet by mouth daily.    [provider]  ?   ? ?Allergies    ?Ciprofloxacin   ? ?Review of Systems   ?Review of Systems  ?Constitutional:  Negative for appetite change.  ?Respiratory:  Negative for shortness of breath.   ?Cardiovascular:  Positive for syncope. Negative for chest pain.  ?Gastrointestinal:  Negative for abdominal pain.  ?Neurological:  Positive for syncope.  ? ?Physical Exam ?Updated Vital Signs ?BP (!) 157/63   Pulse 63   Temp 97.8 ?F (36.6 ?C)   Resp 18   Ht '6\' 1"'$  (1.854 m)   Wt 83 kg   SpO2 97%    BMI 24.14 kg/m?  ?Physical Exam ?Vitals reviewed.  ?HENT:  ?   Head:  ?   Comments: Steri-Stripped well-healing laceration in left occipital area. ?Eyes:  ?   Pupils: Pupils are equal, round, and reactive to light.  ?Cardiovascular:  ?   Rate and Rhythm: Normal rate and regular rhythm.  ?Abdominal:  ?   Tenderness: There is no abdominal tenderness.  ?Musculoskeletal:     ?   General: No tenderness.  ?Skin: ?   General: Skin is warm.  ?   Capillary Refill: Capillary refill takes less than 2 seconds.  ?Neurological:  ?   Mental Status: He is alert and oriented to person, place, and time.  ? ? ?ED Results / Procedures / Treatments   ?Labs ?(all labs ordered are listed, but only abnormal results are displayed) ?Labs Reviewed  ?URINALYSIS, ROUTINE W REFLEX MICROSCOPIC - Abnormal; Notable for the following components:  ?    Result Value  ? Hgb urine dipstick MODERATE (*)   ? Protein, ur TRACE (*)   ? All other components within normal limits  ?COMPREHENSIVE METABOLIC PANEL - Abnormal; Notable for the following components:  ? BUN 25 (*)   ? Creatinine, Ser 1.29 (*)   ?  AST 12 (*)   ? GFR, Estimated 54 (*)   ? All other components within normal limits  ?CBC - Abnormal; Notable for the following components:  ? RBC 3.99 (*)   ? Hemoglobin 12.2 (*)   ? HCT 37.8 (*)   ? All other components within normal limits  ? ? ?EKG ?EKG Interpretation ? ?Date/Time:  Thursday March 26 2022 15:46:56 EDT ?Ventricular Rate:  64 ?PR Interval:  220 ?QRS Duration: 150 ?QT Interval:  466 ?QTC Calculation: 481 ?R Axis:   108 ?Text Interpretation: Sinus rhythm Prolonged PR interval Probable left atrial enlargement RBBB and LPFB No significant change since last tracing Confirmed by Davonna Belling [id number]) on 03/26/2022 4:44:26 PM ? ?Radiology ?CT Head Wo Contrast ? ?Result Date: 03/26/2022 ?CLINICAL DATA:  Fall.  Minor head trauma EXAM: CT HEAD WITHOUT CONTRAST TECHNIQUE: Contiguous axial images were obtained from the base of the skull through the  vertex without intravenous contrast. RADIATION DOSE REDUCTION: This exam was performed according to the departmental dose-optimization program which includes automated exposure control, adjustment of the mA and/or kV according to patient size and/or use of iterative reconstruction technique. COMPARISON:  None. FINDINGS: Brain: Generalized atrophy. Mild patchy white matter hypodensity bilaterally. 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 Negative for acute infarct, hemorrhage, mass Vascular: Negative for hyperdense vessel Skull: Burr hole in the frontal and parietal bone bilaterally. No acute abnormality. Sinuses/Orbits: Negative Other: None IMPRESSION: No acute intracranial abnormality Atrophy and chronic microvascular ischemic change. Electronically Signed   By: Franchot Gallo M.D.   On: 03/26/2022 15:18   ? ?Procedures ?Procedures  ? ? ?Medications Ordered in ED ?Medications - No data to display ? ?ED Course/ Medical Decision Making/ A&P ?  ?                        ?Medical Decision Making ?Amount and/or Complexity of Data Reviewed ?Labs: ordered. ?Radiology: ordered. ? ? ?Patient presents after syncopal episode.  Happened 2 days ago.  Began after urinating.  Potentially micturition syncope.  Syncope does have a large differential diagnosis which does include life-threatening conditions.  However did hit head and had laceration.  CT scan done and independently interpreted and shows no bleeding.  EKG does not show arrhythmia.  Lab work shows mild anemia mild elevated creatinine.  Discussed with patient and family member.  May have some mild component of dehydration.  Feels eager to go home and will follow-up as an outpatient.  Will discharge. ? ? ? ? ? ? ? ?Final Clinical Impression(s) / ED Diagnoses ?Final diagnoses:  ?Syncope, unspecified syncope type  ?Laceration of scalp, initial encounter  ? ? ?Rx / DC Orders ?ED Discharge Orders   ? ? None  ? ?  ? ? ?  ?Davonna Belling, MD ?03/27/22 0005 ? ?

## 2022-03-26 NOTE — Telephone Encounter (Signed)
Patient wife notified and agreed.  

## 2022-04-01 ENCOUNTER — Ambulatory Visit: Payer: Medicare Other | Admitting: Internal Medicine

## 2022-04-01 ENCOUNTER — Encounter: Payer: Self-pay | Admitting: Internal Medicine

## 2022-04-01 VITALS — BP 122/74 | HR 68 | Temp 97.9°F | Ht 73.0 in | Wt 176.4 lb

## 2022-04-01 DIAGNOSIS — I493 Ventricular premature depolarization: Secondary | ICD-10-CM

## 2022-04-01 DIAGNOSIS — Z8719 Personal history of other diseases of the digestive system: Secondary | ICD-10-CM

## 2022-04-01 DIAGNOSIS — R269 Unspecified abnormalities of gait and mobility: Secondary | ICD-10-CM

## 2022-04-01 DIAGNOSIS — I341 Nonrheumatic mitral (valve) prolapse: Secondary | ICD-10-CM

## 2022-04-01 DIAGNOSIS — N289 Disorder of kidney and ureter, unspecified: Secondary | ICD-10-CM

## 2022-04-01 DIAGNOSIS — M6281 Muscle weakness (generalized): Secondary | ICD-10-CM

## 2022-04-01 DIAGNOSIS — R55 Syncope and collapse: Secondary | ICD-10-CM
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
## 2022-04-01 NOTE — Progress Notes (Signed)
A user error has taken place.

## 2022-05-11 ENCOUNTER — Ambulatory Visit (INDEPENDENT_AMBULATORY_CARE_PROVIDER_SITE_OTHER): Payer: Medicare Other

## 2022-05-11 ENCOUNTER — Ambulatory Visit (INDEPENDENT_AMBULATORY_CARE_PROVIDER_SITE_OTHER): Payer: Medicare Other | Admitting: Cardiology

## 2022-05-11 ENCOUNTER — Encounter (HOSPITAL_BASED_OUTPATIENT_CLINIC_OR_DEPARTMENT_OTHER): Payer: Self-pay | Admitting: Cardiology

## 2022-05-11 VITALS — BP 122/58 | HR 63 | Ht 73.0 in | Wt 176.2 lb

## 2022-05-11 DIAGNOSIS — R55 Syncope and collapse: Secondary | ICD-10-CM | POA: Diagnosis not present

## 2022-05-11 DIAGNOSIS — I493 Ventricular premature depolarization: Secondary | ICD-10-CM

## 2022-05-11 DIAGNOSIS — I4510 Unspecified right bundle-branch block: Secondary | ICD-10-CM | POA: Diagnosis not present

## 2022-05-11 DIAGNOSIS — I440 Atrioventricular block, first degree: Secondary | ICD-10-CM | POA: Diagnosis not present

## 2022-05-11 DIAGNOSIS — I341 Nonrheumatic mitral (valve) prolapse: Secondary | ICD-10-CM

## 2022-05-11 DIAGNOSIS — R011 Cardiac murmur, unspecified: Secondary | ICD-10-CM

## 2022-05-11 NOTE — Patient Instructions (Signed)
Medication Instructions:  ?Your Physician recommend you continue on your current medication as directed.   ? ?*If you need a refill on your cardiac medications before your next appointment, please call your pharmacy* ? ? ?Lab Work: ?None ordered today ? ? ?Testing/Procedures: ?Your physician has requested that you have an echocardiogram. Echocardiography is a painless test that uses sound waves to create images of your heart. It provides your doctor with information about the size and shape of your heart and how well your heart?s chambers and valves are working. This procedure takes approximately one hour. There are no restrictions for this procedure. ?[place] ? ?Your physician has recommended that you wear a 14 day Zio monitor.  ? ?This monitor is a medical device that records the heart?s electrical activity. Doctors most often use these monitors to diagnose arrhythmias. Arrhythmias are problems with the speed or rhythm of the heartbeat. The monitor is a small device applied to your chest. You can wear one while you do your normal daily activities. While wearing this monitor if you have any symptoms to push the button and record what you felt. Once you have worn this monitor for the period of time provider prescribed (Usually 14 days), you will return the monitor device in the postage paid box. Once it is returned they will download the data collected and provide [place] with a report which the provider will then review and we will call you with those results. Important tips: ? ?Avoid showering during the first 24 hours of wearing the monitor. ?Avoid excessive sweating to help maximize wear time. ?Do not submerge the device, no hot tubs, and no swimming pools. ?Keep any lotions or oils away from the patch. ?After 24 hours you may shower with the patch on. Take brief showers with your back facing the shower head.  ?Do not remove patch once it has been placed because that will interrupt data and  decrease adhesive wear time. ?Push the button when you have any symptoms and write down what you were feeling. ?Once you have completed wearing your monitor, remove and place into box which has postage paid and place in your outgoing mailbox.  ?If for some reason you have misplaced your box then call our office and we can provide another box and/or mail it off for you. ? ?  ? ? ?Follow-Up: ?At [facility], you and your health needs are our priority.  As part of our continuing mission to provide you with exceptional heart care, we have created designated Provider Care Teams.  These Care Teams include your primary Cardiologist (physician) and Advanced Practice Providers (APPs -  Physician Assistants and Nurse Practitioners) who all work together to provide you with the care you need, when you need it. ? ?We recommend signing up for the patient portal called "MyChart".  Sign up information is provided on this After Visit Summary.  MyChart is used to connect with patients for Virtual Visits (Telemedicine).  Patients are able to view lab/test results, encounter notes, upcoming appointments, etc.  Non-urgent messages can be sent to your provider as well.   ?To learn more about what you can do with MyChart, go to [email or web address].   ? ?Your next appointment:   ?Based on test results ? ?The format for your next appointment:   ?In Person ? ?Provider:   ?Buford Dresser, MD{ ? ? ?Important Information About Sugar ? ? ? ? ? ? ?
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
## 2022-05-24 ENCOUNTER — Encounter (HOSPITAL_BASED_OUTPATIENT_CLINIC_OR_DEPARTMENT_OTHER): Payer: Self-pay | Admitting: Cardiology

## 2022-05-26 NOTE — Addendum Note (Signed)
Addended by: Gerald Stabs on: 05/26/2022 08:57 AM   Modules accepted: Orders

## 2022-05-29 ENCOUNTER — Ambulatory Visit (INDEPENDENT_AMBULATORY_CARE_PROVIDER_SITE_OTHER): Payer: Medicare Other

## 2022-05-29 DIAGNOSIS — I4510 Unspecified right bundle-branch block: Secondary | ICD-10-CM

## 2022-05-29 DIAGNOSIS — I440 Atrioventricular block, first degree: Secondary | ICD-10-CM | POA: Diagnosis not present

## 2022-05-29 DIAGNOSIS — I493 Ventricular premature depolarization: Secondary | ICD-10-CM

## 2022-05-29 DIAGNOSIS — R55 Syncope and collapse: Secondary | ICD-10-CM | POA: Diagnosis not present

## 2022-05-29 DIAGNOSIS — I341 Nonrheumatic mitral (valve) prolapse: Secondary | ICD-10-CM

## 2022-05-29 DIAGNOSIS — R011 Cardiac murmur, unspecified: Secondary | ICD-10-CM

## 2022-05-29 LAB — ECHOCARDIOGRAM COMPLETE
AR max vel: 2.98 cm2
AV Area VTI: 3.02 cm2
AV Area mean vel: 2.85 cm2
AV Mean grad: 3 mmHg
AV Peak grad: 7.4 mmHg
AV Vena cont: 0.75 cm
Ao pk vel: 1.36 m/s
Area-P 1/2: 3.83 cm2
Calc EF: 76.5 %
MV M vel: 4.6 m/s
MV Peak grad: 84.6 mmHg
P 1/2 time: 690 msec
S' Lateral: 3.12 cm
Single Plane A2C EF: 76.5 %
Single Plane A4C EF: 74.7 %

## 2022-06-05 ENCOUNTER — Telehealth (HOSPITAL_BASED_OUTPATIENT_CLINIC_OR_DEPARTMENT_OTHER): Payer: Self-pay
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
## 2022-07-27 ENCOUNTER — Encounter: Payer: Medicare Other | Admitting: Adult Health

## 2022-09-02 ENCOUNTER — Encounter: Payer: Medicare Other | Admitting: Internal Medicine

## 2022-11-12 ENCOUNTER — Telehealth (HOSPITAL_BASED_OUTPATIENT_CLINIC_OR_DEPARTMENT_OTHER): Payer: Self-pay

## 2022-11-12 NOTE — Telephone Encounter (Signed)
Returned call to patient with information provided by [place]. Patient states he does not need an appointment. Instructed patient to speak with his wife and if they decided they wanted an appointment they could let us know.

## 2022-11-12 NOTE — Telephone Encounter (Addendum)
Called patient, he was unsure about needing to be seen. Will route back to Nicholes Rough for additional clarification.     ----- Message from Meryl Crutch, RN sent at 11/11/2022  1:32 PM EST -----  ----- Message ----- From: Miguel Aschoff Sent: 11/11/2022  10:31 AM EST To: Meryl Crutch, RN  This patient would like to make an appointment with Dr Harrell Gave. His wife will be in the office next month.  Thank you  Elgie Collard, PA-C

## 2023-05-06 IMAGING — CT CT HEAD W/O CM
4 series · 17 of 47 positions shown, 19 images · non-contrast
Comparison: None.

CLINICAL DATA: Fall.  Minor head trauma



[Series 2: head wo · axial · 0.45mm/px · z∈[-121,-1]mm · 7 of 33 slices shown, 9 images]
[im 5/33  brain]
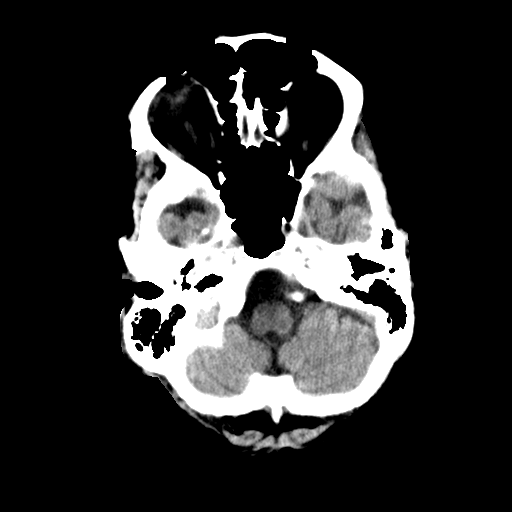
[im 5/33  bone]
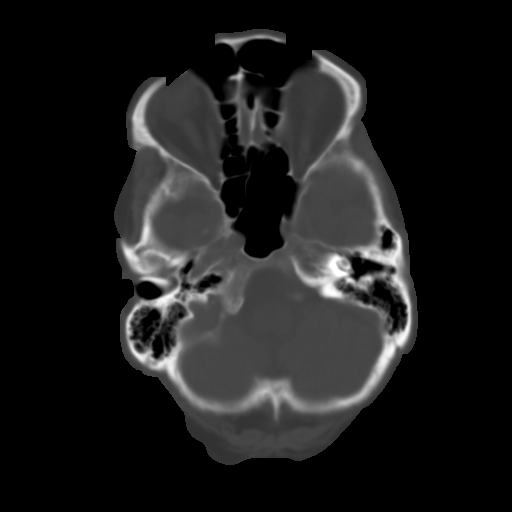
[im 9/33  brain]
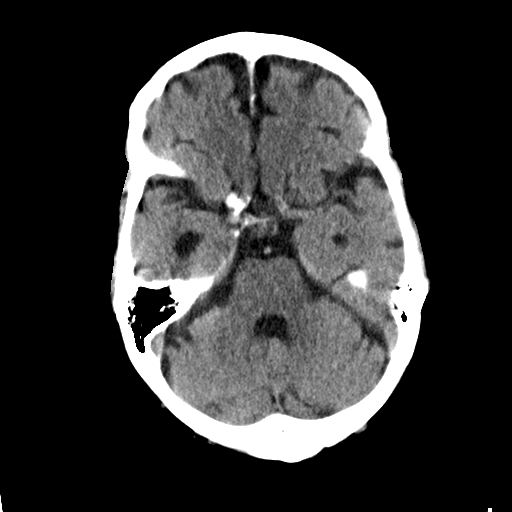
[im 13/33  brain]
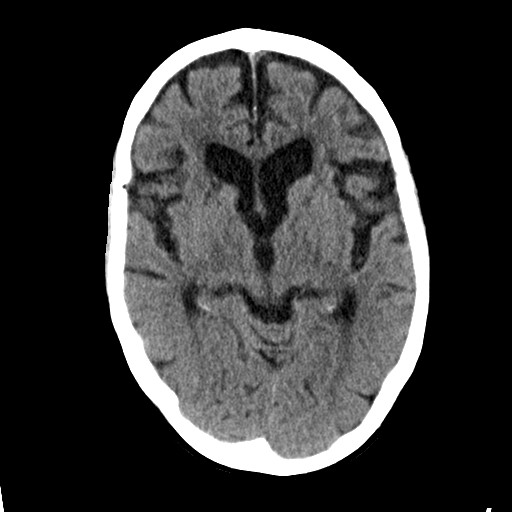
[im 17/33  brain]
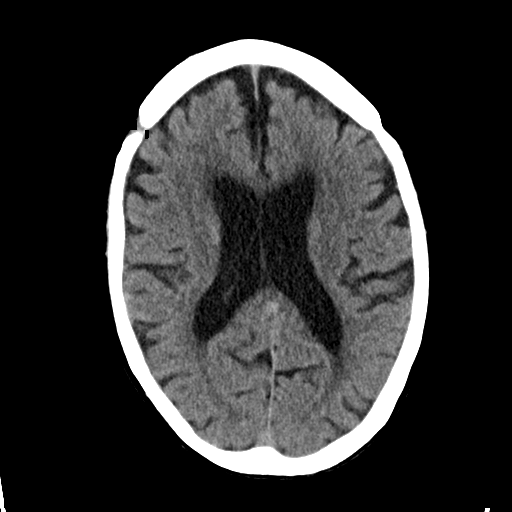
[im 21/33  brain]
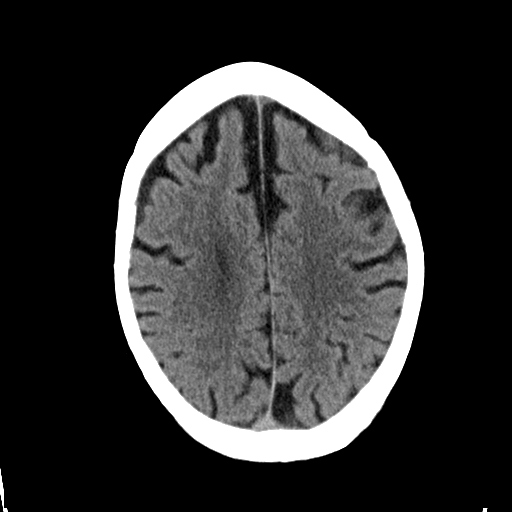
[im 21/33  bone]
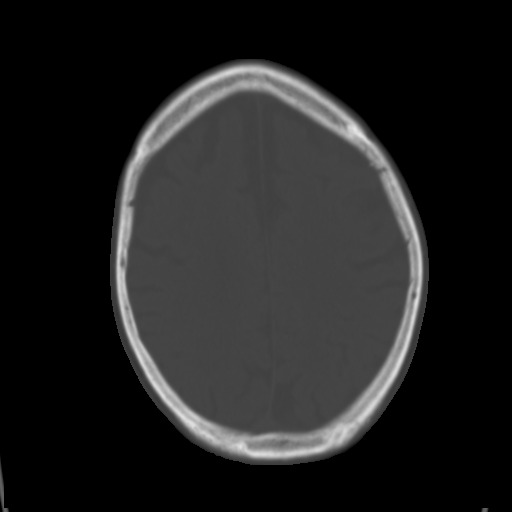
[im 25/33  brain]
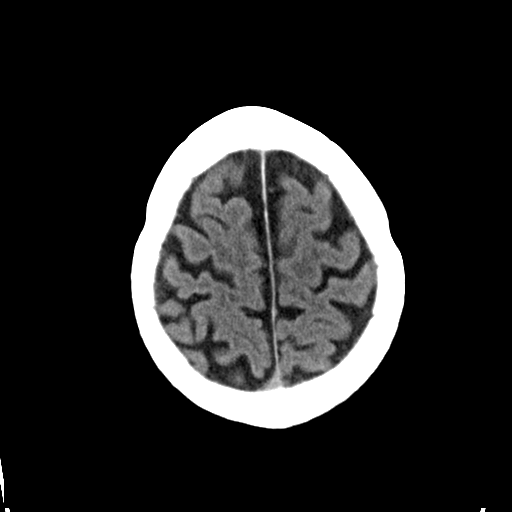
[im 29/33  brain]
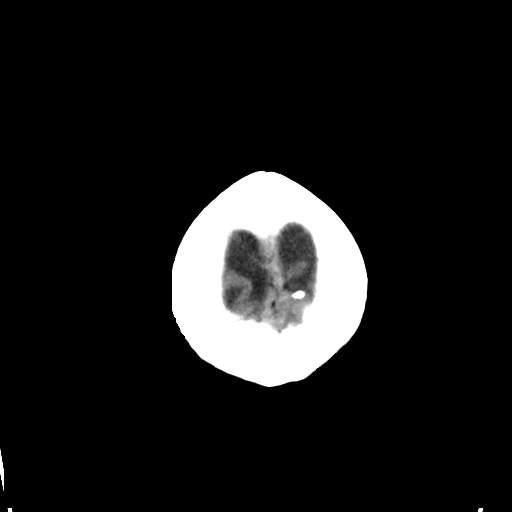

[Series 3: head bone · axial · 0.45mm/px · z∈[-125,-69]mm · 4 of 82 slices shown]
[im 9/82  bone]
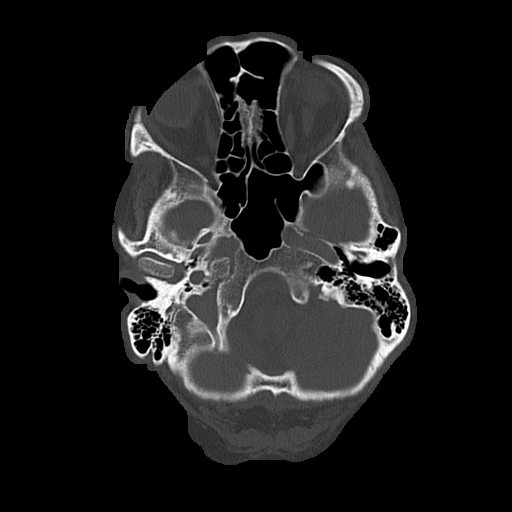
[im 17/82  bone]
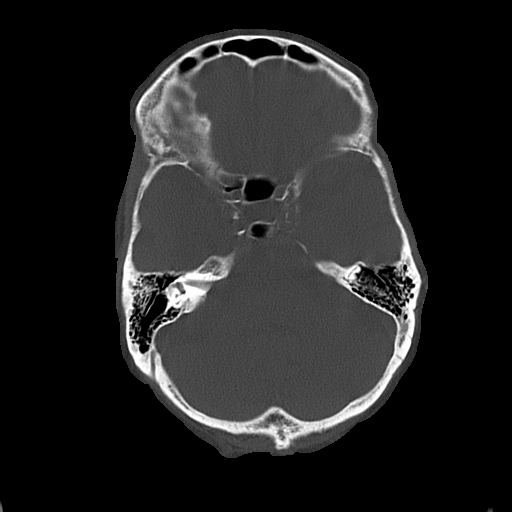
[im 25/82  bone]
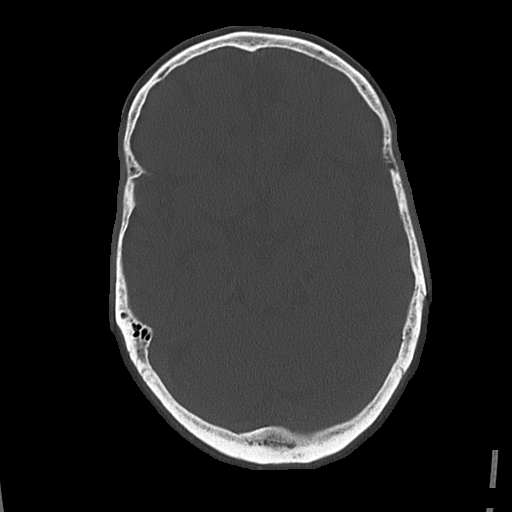
[im 37/82  bone]
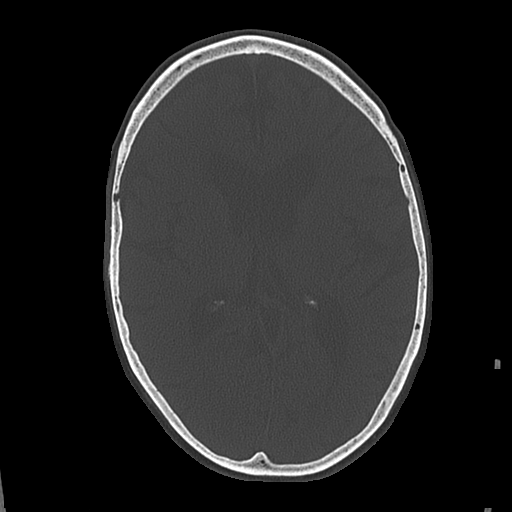

[Series 4: coronal soft · coronal · 0.33mm/px · 3 of 71 slices shown]
[im 24/71  brain]
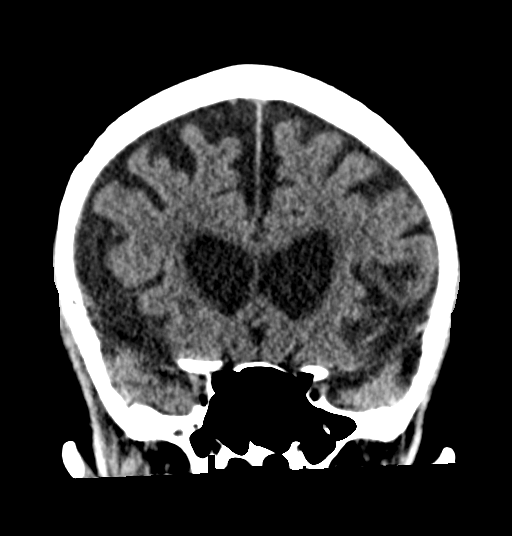
[im 32/71  brain]
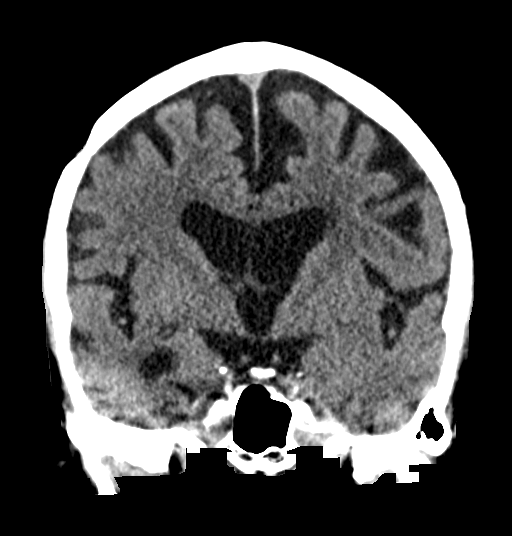
[im 39/71  brain]
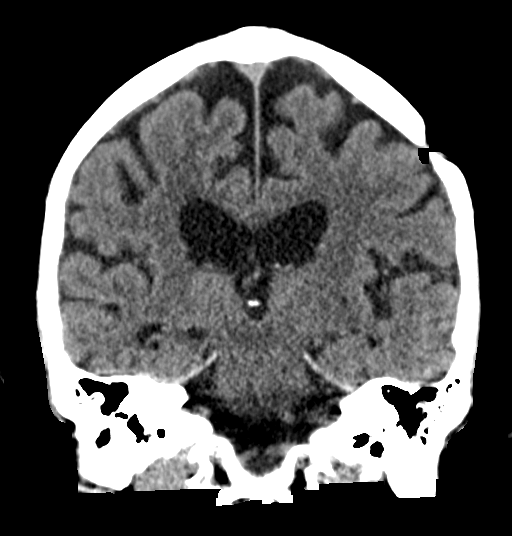

[Series 5: sagittal soft · sagittal · 0.35mm/px · 3 of 57 slices shown]
[im 19/57  brain]
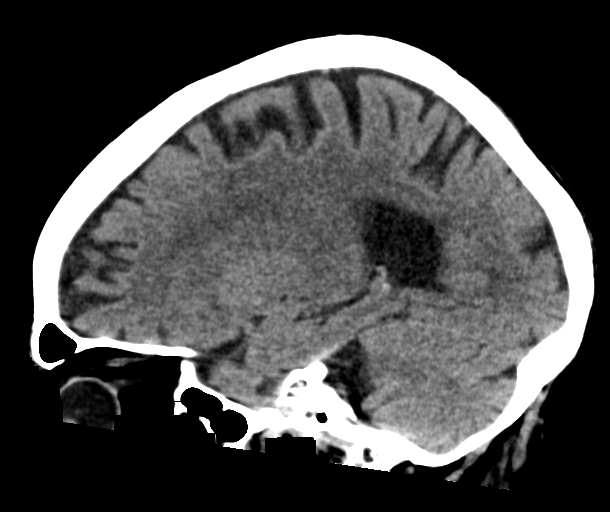
[im 29/57  brain]
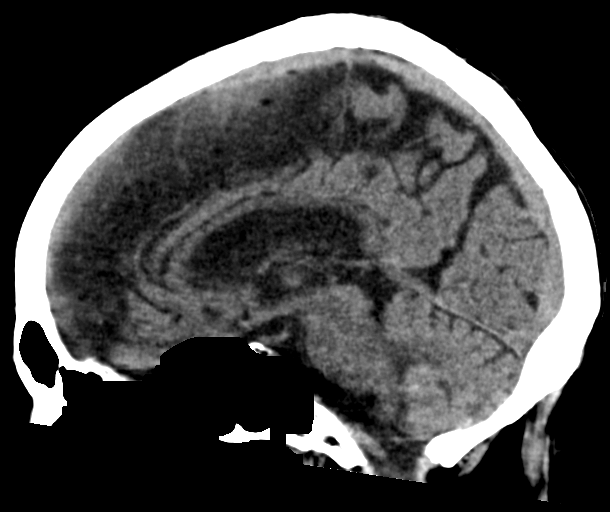
[im 38/57  brain]
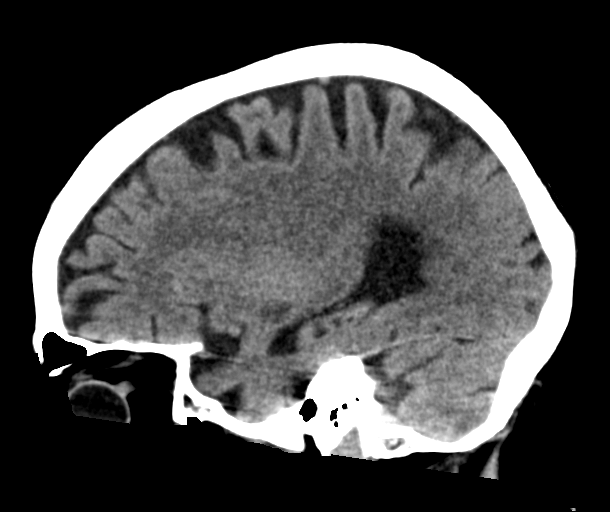

[17 of 47 positions shown; findings below may reference images not displayed]

FINDINGS: Brain: Generalized atrophy. Mild patchy white matter hypodensity
bilaterally. Hypodensity in the right anterior basal ganglia
compatible with chronic infarct.

Negative for acute infarct, hemorrhage, mass

Vascular: Negative for hyperdense vessel

Skull: Burr hole in the frontal and parietal bone bilaterally. No
acute abnormality.

Sinuses/Orbits: Negative

Other: None
IMPRESSION: No acute intracranial abnormality

Atrophy and chronic microvascular ischemic change.

## 2023-09-28 ENCOUNTER — Encounter: Payer: Self-pay | Admitting: Internal Medicine

## 2023-09-28 ENCOUNTER — Non-Acute Institutional Stay: Payer: Medicare Other | Admitting: Internal Medicine

## 2023-09-28 VITALS — BP 122/78 | HR 60 | Temp 97.5°F | Resp 17 | Ht 73.0 in | Wt 155.0 lb

## 2023-09-28 DIAGNOSIS — F330 Major depressive disorder, recurrent, mild: Secondary | ICD-10-CM

## 2023-09-28 DIAGNOSIS — R42 Dizziness and giddiness: Secondary | ICD-10-CM

## 2023-09-28 DIAGNOSIS — R531 Weakness: Secondary | ICD-10-CM

## 2023-09-28 DIAGNOSIS — R4189 Other symptoms and signs involving cognitive functions and awareness: Secondary | ICD-10-CM

## 2023-09-28 DIAGNOSIS — C61 Malignant neoplasm of prostate: Secondary | ICD-10-CM

## 2023-09-28 DIAGNOSIS — R634 Abnormal weight loss: Secondary | ICD-10-CM

## 2023-09-28 DIAGNOSIS — K5901 Slow transit constipation: Secondary | ICD-10-CM | POA: Diagnosis not present

## 2023-09-28 DIAGNOSIS — R350 Frequency of micturition: Secondary | ICD-10-CM
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
## 2023-09-28 NOTE — Patient Instructions (Signed)
Senna Plus Take one tablet at night Can then increase to 2 tablets Start Miralax 17 gm /One full Scoop in the morning Herbert Seta should call you fo labs next week Get RSV and Covid vaccine

## 2023-10-07 ENCOUNTER — Telehealth: Payer: Self-pay

## 2023-10-07 DIAGNOSIS — R55 Syncope and collapse: Secondary | ICD-10-CM

## 2023-10-07 DIAGNOSIS — R634 Abnormal weight loss: Secondary | ICD-10-CM

## 2023-10-07 LAB — CBC: RBC: 4.51 (ref 3.87–5.11)

## 2023-10-07 LAB — CBC AND DIFFERENTIAL
HCT: 42 (ref 41–53)
Hemoglobin: 14.2 (ref 13.5–17.5)
Platelets: 277 10*3/uL (ref 150–400)
WBC: 9.6

## 2023-10-07 LAB — PSA
PSA, FREE: <0.01
PSA: <0.1

## 2023-10-07 NOTE — Telephone Encounter (Signed)
Patients spouse called stating patient had labs this morning at [facility] and she would ike to follow-up on CT order  Please advise

## 2023-10-11 NOTE — Telephone Encounter (Signed)
I have placed the order in [facility] They can call and Schedule it Let them also know that it will take 1-2 weeks before it would be read His labs also show he is dehydrated and needs to drink more water

## 2023-10-11 NOTE — Telephone Encounter (Signed)
When I called to provide the scheduling number for [facility], pt's wife asked for all of the results of pt's last lab draw. She states that pt was very interested to know what his PSA level was.    Please call Wife Gershon Cull back at [phone]  Thanks,  Alcario Drought

## 2023-10-11 NOTE — Telephone Encounter (Signed)
I have d/w mrs Fort already. Gave her copy of his lab results

## 2023-10-11 NOTE — Telephone Encounter (Signed)
Left a detailed voicemail with Dr.Gupta's response and encouraged patient to return call if any questions

## 2023-10-14 ENCOUNTER — Other Ambulatory Visit: Payer: Self-pay | Admitting: Internal Medicine

## 2023-10-14 ENCOUNTER — Ambulatory Visit (HOSPITAL_BASED_OUTPATIENT_CLINIC_OR_DEPARTMENT_OTHER)
Admission: RE | Admit: 2023-10-14 | Discharge: 2023-10-14 | Disposition: A | Discharge status: Home / Self Care | Payer: Medicare Other | Source: Ambulatory Visit | Attending: Internal Medicine | Admitting: Internal Medicine

## 2023-10-14 DIAGNOSIS — R634 Abnormal weight loss: Secondary | ICD-10-CM | POA: Insufficient documentation

## 2023-10-14 DIAGNOSIS — K5731 Diverticulosis of large intestine without perforation or abscess with bleeding: Secondary | ICD-10-CM | POA: Diagnosis not present

## 2023-10-14 DIAGNOSIS — R531 Weakness: Secondary | ICD-10-CM | POA: Diagnosis not present

## 2023-10-14 DIAGNOSIS — R0989 Other specified symptoms and signs involving the circulatory and respiratory systems: Secondary | ICD-10-CM

## 2023-10-14 DIAGNOSIS — R55 Syncope and collapse: Secondary | ICD-10-CM | POA: Insufficient documentation

## 2023-10-14 LAB — POCT I-STAT CREATININE: Creatinine, Ser: 1 mg/dL (ref 0.61–1.24)

## 2023-10-14 MED ORDER — IOHEXOL 300 MG/ML  SOLN
100.0000 mL | Freq: Once | INTRAMUSCULAR | Status: AC | PRN
  Administered 2023-10-14: 100 mL via INTRAVENOUS

## 2023-10-15 ENCOUNTER — Encounter (HOSPITAL_COMMUNITY): Payer: Self-pay | Admitting: Internal Medicine

## 2023-10-15 ENCOUNTER — Inpatient Hospital Stay (HOSPITAL_COMMUNITY)
Admission: EM | Admit: 2023-10-15 | Discharge: 2023-10-22 | DRG: 377 | Disposition: A | Discharge status: Home / Self Care | Payer: Medicare Other | Attending: Internal Medicine | Admitting: Internal Medicine

## 2023-10-15 ENCOUNTER — Other Ambulatory Visit: Payer: Self-pay

## 2023-10-15 DIAGNOSIS — Z681 Body mass index (BMI) 19 or less, adult: Secondary | ICD-10-CM

## 2023-10-15 DIAGNOSIS — D649 Anemia, unspecified: Secondary | ICD-10-CM

## 2023-10-15 DIAGNOSIS — Z881 Allergy status to other antibiotic agents status: Secondary | ICD-10-CM

## 2023-10-15 DIAGNOSIS — E43 Unspecified severe protein-calorie malnutrition: Secondary | ICD-10-CM | POA: Diagnosis present

## 2023-10-15 DIAGNOSIS — K5900 Constipation, unspecified: Secondary | ICD-10-CM | POA: Diagnosis present

## 2023-10-15 DIAGNOSIS — K625 Hemorrhage of anus and rectum: Principal | ICD-10-CM | POA: Diagnosis present

## 2023-10-15 DIAGNOSIS — Z8661 Personal history of infections of the central nervous system: Secondary | ICD-10-CM

## 2023-10-15 DIAGNOSIS — I2510 Atherosclerotic heart disease of native coronary artery without angina pectoris: Secondary | ICD-10-CM | POA: Diagnosis present

## 2023-10-15 DIAGNOSIS — I341 Nonrheumatic mitral (valve) prolapse: Secondary | ICD-10-CM | POA: Diagnosis present

## 2023-10-15 DIAGNOSIS — E872 Acidosis, unspecified: Secondary | ICD-10-CM | POA: Diagnosis present

## 2023-10-15 DIAGNOSIS — E8809 Other disorders of plasma-protein metabolism, not elsewhere classified: Secondary | ICD-10-CM | POA: Diagnosis present

## 2023-10-15 DIAGNOSIS — E785 Hyperlipidemia, unspecified: Secondary | ICD-10-CM | POA: Diagnosis present

## 2023-10-15 DIAGNOSIS — K5731 Diverticulosis of large intestine without perforation or abscess with bleeding: Principal | ICD-10-CM | POA: Diagnosis present

## 2023-10-15 DIAGNOSIS — Z66 Do not resuscitate: Secondary | ICD-10-CM | POA: Diagnosis present

## 2023-10-15 DIAGNOSIS — D62 Acute posthemorrhagic anemia: Secondary | ICD-10-CM | POA: Diagnosis present

## 2023-10-15 DIAGNOSIS — Z8546 Personal history of malignant neoplasm of prostate: Secondary | ICD-10-CM

## 2023-10-15 DIAGNOSIS — Z800 Family history of malignant neoplasm of digestive organs: Secondary | ICD-10-CM

## 2023-10-15 LAB — CBC
HCT: 35.3 % — ABNORMAL LOW (ref 39.0–52.0)
Hemoglobin: 11.4 g/dL — ABNORMAL LOW (ref 13.0–17.0)
MCH: 31.6 pg (ref 26.0–34.0)
MCHC: 32.3 g/dL (ref 30.0–36.0)
MCV: 97.8 fL (ref 80.0–100.0)
Platelets: 224 10*3/uL (ref 150–400)
RBC: 3.61 MIL/uL — ABNORMAL LOW (ref 4.22–5.81)
RDW: 12.7 % (ref 11.5–15.5)
WBC: 9.3 10*3/uL (ref 4.0–10.5)
nRBC: 0 % (ref 0.0–0.2)

## 2023-10-15 LAB — PROTIME-INR
INR: 1 (ref 0.8–1.2)
Prothrombin Time: 12.9 s (ref 11.4–15.2)

## 2023-10-15 LAB — COMPREHENSIVE METABOLIC PANEL WITH GFR
ALT: 12 U/L (ref 0–44)
AST: 18 U/L (ref 15–41)
Albumin: 3.6 g/dL (ref 3.5–5.0)
Alkaline Phosphatase: 53 U/L (ref 38–126)
Anion gap: 9 (ref 5–15)
BUN: 26 mg/dL — ABNORMAL HIGH (ref 8–23)
CO2: 24 mmol/L (ref 22–32)
Calcium: 8.7 mg/dL — ABNORMAL LOW (ref 8.9–10.3)
Chloride: 107 mmol/L (ref 98–111)
Creatinine, Ser: 0.93 mg/dL (ref 0.61–1.24)
GFR, Estimated: >60 mL/min
Glucose, Bld: 136 mg/dL — ABNORMAL HIGH (ref 70–99)
Potassium: 3.9 mmol/L (ref 3.5–5.1)
Sodium: 140 mmol/L (ref 135–145)
Total Bilirubin: 0.7 mg/dL (ref 0.3–1.2)
Total Protein: 6.4 g/dL — ABNORMAL LOW (ref 6.5–8.1)

## 2023-10-15 LAB — HEMOGLOBIN AND HEMATOCRIT, BLOOD
HCT: 30.3 % — ABNORMAL LOW (ref 39.0–52.0)
Hemoglobin: 9.5 g/dL — ABNORMAL LOW (ref 13.0–17.0)

## 2023-10-15 MED ORDER — ONDANSETRON HCL 4 MG PO TABS: 4.0000 mg | ORAL_TABLET | Freq: Four times a day (QID) | ORAL | Status: DC | PRN

## 2023-10-15 MED ORDER — TRAZODONE HCL 50 MG PO TABS: 25.0000 mg | ORAL_TABLET | Freq: Every evening | ORAL | Status: DC | PRN

## 2023-10-15 MED ORDER — ONDANSETRON HCL 4 MG/2ML IJ SOLN: 4.0000 mg | Freq: Four times a day (QID) | INTRAMUSCULAR | Status: DC | PRN

## 2023-10-15 MED ORDER — ALBUTEROL SULFATE (2.5 MG/3ML) 0.083% IN NEBU: 2.5000 mg | INHALATION_SOLUTION | RESPIRATORY_TRACT | Status: DC | PRN

## 2023-10-15 MED ORDER — ACETAMINOPHEN 325 MG PO TABS: 650.0000 mg | ORAL_TABLET | Freq: Four times a day (QID) | ORAL | Status: DC | PRN

## 2023-10-15 MED ORDER — ACETAMINOPHEN 650 MG RE SUPP: 650.0000 mg | Freq: Four times a day (QID) | RECTAL | Status: DC | PRN

## 2023-10-15 NOTE — ED Provider Notes (Signed)
[place] EMERGENCY DEPARTMENT AT [facility] Provider Note   CSN: [id number] Arrival date & time: 10/15/23  1557     History  Chief Complaint  Patient presents with   Weakness   Rectal Bleeding    Aaron Harmon is a 89 y.o. male.  89 year old male with no reported past medical history presenting to the emergency department today with rectal bleeding.  The patient states that he had a fecal impaction earlier this week.  He states that he manually disimpacted himself and was initially feeling better.  He states that over the past 3 to 4 days that he has been having multiple episodes of rectal bleeding per day.  He is starting to feel lightheaded and generally weak.  He is not on any blood thinners.  He apparently had a drop in his blood pressure to the 60s systolic when medics went to bring the patient into the ER for further evaluation.  He denies any history of rectal bleeding.  Denies any abdominal pain currently.  He denies any nausea or vomiting.   Weakness Associated symptoms: hematochezia   Rectal Bleeding      Home Medications Prior to Admission medications   Medication Sig Start Date End Date Taking? Authorizing Provider  MAGNESIUM CITRATE PO Take 500 mg by mouth daily. Patient not taking: Reported on 09/28/2023    [provider]  polyethylene glycol (MIRALAX / GLYCOLAX) 17 g packet Take 17 g by mouth daily. Patient not taking: Reported on 09/28/2023    [provider]      Allergies    Ciprofloxacin    Review of Systems   Review of Systems  Gastrointestinal:  Positive for blood in stool and hematochezia.  Neurological:  Positive for weakness.  All other systems reviewed and are negative.   Physical Exam Updated Vital Signs BP 127/69 (BP Location: Left Arm)   Pulse 68   Temp (!) 97.5 F (36.4 C) (Oral)   Resp 16   Ht 6\' 1"  (1.854 m)   Wt 63.5 kg   SpO2 99%   BMI 18.47 kg/m  Physical Exam Vitals and nursing note  reviewed.   Gen: NAD Eyes: PERRL, EOMI HEENT: no oropharyngeal swelling Neck: trachea midline Resp: clear to auscultation bilaterally Card: RRR, no murmurs, rubs, or gallops Abd: nontender, nondistended Rectal: Chaperoned by male nursing staff shows gross blood, no obvious active bleeding hemorrhoids noted Extremities: no calf tenderness, no edema Vascular: 2+ radial pulses bilaterally, 2+ DP pulses bilaterally Skin: no rashes Psyc: acting appropriately   ED Results / Procedures / Treatments   Labs (all labs ordered are listed, but only abnormal results are displayed) Labs Reviewed  COMPREHENSIVE METABOLIC PANEL - Abnormal; Notable for the following components:      Result Value   Glucose, Bld 136 (*)    BUN 26 (*)    Calcium 8.7 (*)    Total Protein 6.4 (*)    All other components within normal limits  CBC - Abnormal; Notable for the following components:   RBC 3.61 (*)    Hemoglobin 11.4 (*)    HCT 35.3 (*)    All other components within normal limits  PROTIME-INR  TYPE AND SCREEN    EKG None  Radiology No results found.  Procedures Procedures    Medications Ordered in ED Medications - No data to display  ED Course/ Medical Decision Making/ A&P  Medical Decision Making 89 year old male presenting to the emergency department today with rectal bleeding and generalized weakness.  The patient was orthostatic with medics earlier today.  I will obtain basic labs here as well as PT/INR to evaluate for anemia.  The patient is obviously bleeding and has bright red blood per rectum.  With his findings he will require admission.  Will obtain a type and screen in the event the patient does require transfusion.  This does not appear to be secondary to upper GI bleeding so we will hold off on Protonix.  The patient's labs are reassuring with exception of a 3 g drop in his hemoglobin from the 10th of this month.  Given the symptoms with the  bright red blood per rectum and his age calls placed to hospitalist service for admission.  Amount and/or Complexity of Data Reviewed Labs: ordered.          Final Clinical Impression(s) / ED Diagnoses Final diagnoses:  Rectal bleeding  Anemia, unspecified type    Rx / DC Orders ED Discharge Orders     None         Durwin Glaze, MD 10/15/23 1736

## 2023-10-15 NOTE — ED Notes (Signed)
Attempt made x2 at this time to contact nursing unit for heads up on pt to transfer upstairs unsuccessful and no answer received, call was also forwarded to 4 east nurses station and no answer received there either. [place] Press photographer notified.

## 2023-10-15 NOTE — ED Triage Notes (Signed)
Pt BIBA from the independent side of [facility]. C/o weakness for several days, and dizziness when they stand up. Has had rectal bleeding for 3 days.  Aox4  BP: 132/64 HR: 76 SPO2: 157
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
## 2023-10-16 DIAGNOSIS — K625 Hemorrhage of anus and rectum: Secondary | ICD-10-CM | POA: Diagnosis not present

## 2023-10-16 DIAGNOSIS — Z800 Family history of malignant neoplasm of digestive organs: Secondary | ICD-10-CM | POA: Diagnosis not present

## 2023-10-16 DIAGNOSIS — E43 Unspecified severe protein-calorie malnutrition: Secondary | ICD-10-CM | POA: Diagnosis present

## 2023-10-16 DIAGNOSIS — K5731 Diverticulosis of large intestine without perforation or abscess with bleeding: Secondary | ICD-10-CM | POA: Diagnosis present

## 2023-10-16 DIAGNOSIS — E8809 Other disorders of plasma-protein metabolism, not elsewhere classified: Secondary | ICD-10-CM | POA: Diagnosis present

## 2023-10-16 DIAGNOSIS — D62 Acute posthemorrhagic anemia: Secondary | ICD-10-CM | POA: Diagnosis present

## 2023-10-16 DIAGNOSIS — R531 Weakness: Secondary | ICD-10-CM | POA: Diagnosis present

## 2023-10-16 DIAGNOSIS — Z8661 Personal history of infections of the central nervous system: Secondary | ICD-10-CM | POA: Diagnosis not present

## 2023-10-16 DIAGNOSIS — Z66 Do not resuscitate: Secondary | ICD-10-CM | POA: Diagnosis present

## 2023-10-16 DIAGNOSIS — D649 Anemia, unspecified: Secondary | ICD-10-CM | POA: Diagnosis not present

## 2023-10-16 DIAGNOSIS — Z8546 Personal history of malignant neoplasm of prostate: Secondary | ICD-10-CM | POA: Diagnosis not present

## 2023-10-16 DIAGNOSIS — E872 Acidosis, unspecified: Secondary | ICD-10-CM | POA: Diagnosis present

## 2023-10-16 DIAGNOSIS — I341 Nonrheumatic mitral (valve) prolapse: Secondary | ICD-10-CM | POA: Diagnosis present

## 2023-10-16 DIAGNOSIS — Z881 Allergy status to other antibiotic agents status: Secondary | ICD-10-CM | POA: Diagnosis not present

## 2023-10-16 DIAGNOSIS — K5900 Constipation, unspecified: Secondary | ICD-10-CM | POA: Diagnosis present

## 2023-10-16 DIAGNOSIS — Z681 Body mass index (BMI) 19 or less, adult: Secondary | ICD-10-CM | POA: Diagnosis not present

## 2023-10-16 DIAGNOSIS — E785 Hyperlipidemia, unspecified: Secondary | ICD-10-CM | POA: Diagnosis present

## 2023-10-16 DIAGNOSIS — I2510 Atherosclerotic heart disease of native coronary artery without angina pectoris: Secondary | ICD-10-CM | POA: Diagnosis present

## 2023-10-16 LAB — COMPREHENSIVE METABOLIC PANEL
ALT: 11 U/L (ref 0–44)
AST: 15 U/L (ref 15–41)
Albumin: 3.1 g/dL — ABNORMAL LOW (ref 3.5–5.0)
Alkaline Phosphatase: 42 U/L (ref 38–126)
Anion gap: 7 (ref 5–15)
BUN: 27 mg/dL — ABNORMAL HIGH (ref 8–23)
CO2: 23 mmol/L (ref 22–32)
Calcium: 8.4 mg/dL — ABNORMAL LOW (ref 8.9–10.3)
Chloride: 109 mmol/L (ref 98–111)
Creatinine, Ser: 0.78 mg/dL (ref 0.61–1.24)
GFR, Estimated: >60 mL/min (ref >60)
Glucose, Bld: 106 mg/dL — ABNORMAL HIGH (ref 70–99)
Potassium: 3.8 mmol/L (ref 3.5–5.1)
Sodium: 139 mmol/L (ref 135–145)
Total Bilirubin: 0.7 mg/dL (ref 0.3–1.2)
Total Protein: 5.4 g/dL — ABNORMAL LOW (ref 6.5–8.1)

## 2023-10-16 LAB — HEMOGLOBIN AND HEMATOCRIT, BLOOD
HCT: 28.5 % — ABNORMAL LOW (ref 39.0–52.0)
HCT: 25.5 % — ABNORMAL LOW (ref 39.0–52.0)
Hemoglobin: 9.2 g/dL — ABNORMAL LOW (ref 13.0–17.0)
Hemoglobin: 8.1 g/dL — ABNORMAL LOW (ref 13.0–17.0)

## 2023-10-16 LAB — CBC WITH DIFFERENTIAL/PLATELET
Abs Immature Granulocytes: 0.02 10*3/uL (ref 0.00–0.07)
Basophils Absolute: 0 10*3/uL (ref 0.0–0.1)
Basophils Relative: 0 %
Eosinophils Absolute: 0.1 10*3/uL (ref 0.0–0.5)
Eosinophils Relative: 1 %
HCT: 29.4 % — ABNORMAL LOW (ref 39.0–52.0)
Hemoglobin: 9.3 g/dL — ABNORMAL LOW (ref 13.0–17.0)
Immature Granulocytes: 0 %
Lymphocytes Relative: 22 %
Lymphs Abs: 1.6 10*3/uL (ref 0.7–4.0)
MCH: 31.2 pg (ref 26.0–34.0)
MCHC: 31.6 g/dL (ref 30.0–36.0)
MCV: 98.7 fL (ref 80.0–100.0)
Monocytes Absolute: 0.5 10*3/uL (ref 0.1–1.0)
Monocytes Relative: 7 %
Neutro Abs: 5 10*3/uL (ref 1.7–7.7)
Neutrophils Relative %: 70 %
Platelets: 214 10*3/uL (ref 150–400)
RBC: 2.98 MIL/uL — ABNORMAL LOW (ref 4.22–5.81)
RDW: 12.8 % (ref 11.5–15.5)
WBC: 7.2 10*3/uL (ref 4.0–10.5)
nRBC: 0 % (ref 0.0–0.2)

## 2023-10-16 LAB — PHOSPHORUS: Phosphorus: 3.5 mg/dL (ref 2.5–4.6)

## 2023-10-16 LAB — MAGNESIUM: Magnesium: 2.1 mg/dL (ref 1.7–2.4)

## 2023-10-16 MED ORDER — ORAL CARE MOUTH RINSE: 15.0000 mL | OROMUCOSAL | Status: DC | PRN

## 2023-10-16 NOTE — Progress Notes (Signed)
Received in report from off going RN that Pt bladder scanner revealed 410 cc if urine. That RN attempted to in and out cath pt without success. This RN went to reassess GU status. Upon arrival to the room the pt had voided 100 cc of urine in urinal. Re scanned the bladder and found to have 280 cc's

## 2023-10-16 NOTE — Progress Notes (Signed)
Off going RN and PT both attempted to obtain orthostatic vitals. Pt became dizzy and unable to tolerate. Noted drops in bp and increased heart rate. MD notified

## 2023-10-16 NOTE — Progress Notes (Signed)
Pt & wife updated on recent Hgb & Hct results. Pt & wife requesting an update from the hospitalist. Request sent earlier by charge RN.

## 2023-10-16 NOTE — Hospital Course (Signed)
HPI per Dr. 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  He has a known history of diverticulosis, recently he has been constipated and says that he manually disimpacted himself a couple of days ago, after which she has had bright red blood per rectum intermittently for the last 3 days.  He had some slight left lower quadrant abdominal pain a couple of days ago, but this resolved with bowel movement.  He takes no blood thinners, today started to come to the ER for evaluation because he got orthostatic this afternoon when he stood up.  Due to the abdominal pain, outpatient CT scan was performed yesterday, but this has not been interpreted.   ED Course: During the emergency department, he has been afebrile blood pressure 127/69.  Saturating 100% on room air.  Lab work was reviewed, significant for normal INR, and hemoglobin down to 12 from 14.2 on 10/10.  **Interim History  GI evaluated and recommending conservative management and recommending continuing to Monitor H/H and have placed patient on CLD.   Assessment and Plan:  Bright Red Blood Per Rectum and Acute Blood Loss Anemia -This is likely diverticular bleeding, but differential also includes AVMs, internal/external hemorrhoids, etc. -Observation admission -Hgb/Hct Trend: Recent Labs  Lab 10/07/23 0000 10/07/23 0000 10/15/23 1635 10/15/23 1848 10/16/23 0220 10/16/23 1009 10/16/23 1856  HGB 14.2  --  11.4* 9.5* 9.2* 9.3* 8.1*  HCT 42  --  35.3* 30.3* 28.5* 29.4* 25.5*  MCV  --    < > 97.8  --   --  98.7  --    < > = values in this interval not displayed.  -Check Anemia Panel in the AM -Continue to Monitor on progressive unit with telemetry -Avoid blood thinners -Recently had a CT Chest/Abdomen/Pelvis -Clear liquid diet for now and diet advancement per GI  -Trend  hemoglobin every 8 hours -GI consulted and recommending conservative management  -Will need a Stat CT bleeding study in case of profuse bleeding   Acute Blood Loss Anemia -As above   Weight Loss -Patient's wife notes he has lost about 20 pounds this year, he has very low appetite. -CT Chest/Abd/Pelvis obtained outpatient and pending read -RD consultation -PT/OT evaluated and recommending SNF  Generalized Weakness -PT/OT recommending SNF  Hx of Prostate Cancer -Follow up with Oncology/Urology as an outpatient  -PCP recently checking PSA  Constipation -Initiate Bowel Regiment if necessary -GI Following and he had a recent digital fecal disimpaction  Mitral Valve Prolapse -Noted  Hypoalbuminemia -Patient's Albumin Trend: Recent Labs  Lab 10/15/23 1635 10/16/23 1009  ALBUMIN 3.6 3.1*  -Continue to Monitor and Trend and repeat CMP in the AM

## 2023-10-16 NOTE — Progress Notes (Addendum)
   10/16/23 0620  Urine Characteristics  Urinary Interventions Bladder scan  Bladder Scan Volume (mL) 391 mL   Patient alert, no c/o pain, patient has no urine output since last night @ 2200, Patient has no urge to urinate and this RN assisted patient to stand at bedside but patient c/o dizziness. Patient also has black stool x1 this morning. NP Virgel Manifold, notified.

## 2023-10-16 NOTE — Evaluation (Signed)
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  Clinical Impression  Pt admitted with above diagnosis.  Pt currently with functional limitations due to the deficits listed below (see PT Problem List). Pt will benefit from acute skilled PT to increase their independence and safety with mobility to allow discharge.  Pt reports having issues with dizziness and weakness prior to admission.  Pt hesitant to mobilize due to BPs yesterday but agreeable to attempt with therapist monitoring.  Pt supervision with bed mobility however felt unable to tolerate standing.  Vitals below.  Pt reported he felt rectal bleeding upon return to supine so RN immediately notified of BPs and bleeding.  Pt and spouse feel pt will need to be modified independent to return to ILF so depending on pt's progress, pt may need rehab SNF section of facility upon d/c.     10/16/23 1115  Vital Signs  BP Location Left Arm  BP Method Automatic  Patient Position (if appropriate) Orthostatic Vitals  Orthostatic Lying   BP- Lying 125/49  Pulse- Lying 65  Orthostatic Sitting  BP- Sitting 98/57  Pulse- Sitting 72           If plan is discharge home, recommend the following: A lot of help with walking and/or transfers;A lot of help with bathing/dressing/bathroom;Assistance with cooking/housework;Help with stairs or ramp for entrance   Can travel by private vehicle        Equipment Recommendations None recommended by PT  Recommendations for Other Services       Functional Status Assessment Patient has had a recent decline in their functional status and demonstrates the ability to make significant improvements in function in a reasonable and  predictable amount of time.     Precautions / Restrictions Precautions Precautions: Fall      Mobility  Bed Mobility Overal bed mobility: Needs Assistance Bed Mobility: Supine to Sit, Sit to Supine     Supine to sit: Supervision Sit to supine: Supervision        Transfers                   General transfer comment: pt felt unable to tolerate, weakness/dizziness    Ambulation/Gait                  Stairs            Wheelchair Mobility     Tilt Bed    Modified Rankin (Stroke Patients Only)       Balance                                             Pertinent Vitals/Pain Pain Assessment Pain Assessment: No/denies pain    Home Living   Living Arrangements: Spouse/significant other   Type of Home: Independent living facility           Home Equipment: Rollator (4 wheels)      Prior Function Prior Level of Function : Independent/Modified Independent             Mobility Comments: using rollator       Extremity/Trunk Assessment        Lower  Extremity Assessment Lower Extremity Assessment: Generalized weakness    Cervical / Trunk Assessment Cervical / Trunk Assessment: Kyphotic  Communication   Communication Communication: No apparent difficulties  Cognition Arousal: Alert Behavior During Therapy: WFL for tasks assessed/performed Overall Cognitive Status: Within Functional Limits for tasks assessed                                          General Comments      Exercises     Assessment/Plan    PT Assessment Patient needs continued PT services  PT Problem List Decreased mobility;Decreased activity tolerance;Decreased strength;Cardiopulmonary status limiting activity;Decreased knowledge of use of DME       PT Treatment Interventions DME instruction;Balance training;Functional mobility training;Therapeutic activities;Therapeutic exercise;Patient/family education    PT  Goals (Current goals can be found in the Care Plan section)  Acute Rehab PT Goals PT Goal Formulation: With patient Time For Goal Achievement: 10/30/23 Potential to Achieve Goals: Good    Frequency Min 1X/week     Co-evaluation               AM-PAC PT "6 Clicks" Mobility  Outcome Measure Help needed turning from your back to your side while in a flat bed without using bedrails?: A Little Help needed moving from lying on your back to sitting on the side of a flat bed without using bedrails?: A Little Help needed moving to and from a bed to a chair (including a wheelchair)?: A Lot Help needed standing up from a chair using your arms (e.g., wheelchair or bedside chair)?: A Lot Help needed to walk in hospital room?: A Lot Help needed climbing 3-5 steps with a railing? : A Lot 6 Click Score: 14    End of Session   Activity Tolerance: Treatment limited secondary to medical complications (Comment) Patient left: in bed;with call bell/phone within reach;with family/visitor present Nurse Communication: Mobility status PT Visit Diagnosis: Difficulty in walking, not elsewhere classified (R26.2);Muscle weakness (generalized) (M62.81)    Time: 2130-8657 PT Time Calculation (min) (ACUTE ONLY): 17 min   Charges:   PT Evaluation $PT Eval Low Complexity: 1 Low   PT General Charges $$ ACUTE PT VISIT: 1 Visit        Thomasene Mohair PT, DPT Physical Therapist Acute Rehabilitation Services Office: [phone]   Aaron Harmon [place] 10/16/2023, 12:28 PM
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
## 2023-10-16 NOTE — Progress Notes (Signed)
PROGRESS NOTE    Aaron Harmon  MWU:[id number] DOB: 03/29/1933 DOA: 10/15/2023 PCP: [name], MD   Brief Narrative:  HPI per Dr. 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  He has a known history of diverticulosis, recently he has been constipated and says that he manually disimpacted himself a couple of days ago, after which she has had bright red blood per rectum intermittently for the last 3 days.  He had some slight left lower quadrant abdominal pain a couple of days ago, but this resolved with bowel movement.  He takes no blood thinners, today started to come to the ER for evaluation because he got orthostatic this afternoon when he stood up.  Due to the abdominal pain, outpatient CT scan was performed yesterday, but this has not been interpreted.   ED Course: During the emergency department, he has been afebrile blood pressure 127/69.  Saturating 100% on room air.  Lab work was reviewed, significant for normal INR, and hemoglobin down to 12 from 14.2 on 10/10.  **Interim History  GI evaluated and recommending conservative management and recommending continuing to Monitor H/H and have placed patient on CLD.   Assessment and Plan:  Bright Red Blood Per Rectum and Acute Blood Loss Anemia -This is likely diverticular bleeding, but differential also includes AVMs, internal/external hemorrhoids, etc. -Observation admission -Hgb/Hct Trend: Recent Labs  Lab 10/07/23 0000 10/07/23 0000 10/15/23 1635 10/15/23 1848 10/16/23 0220 10/16/23 1009 10/16/23 1856  HGB 14.2  --  11.4* 9.5* 9.2* 9.3* 8.1*  HCT 42  --  35.3* 30.3* 28.5* 29.4* 25.5*  MCV  --    < > 97.8  --   --  98.7  --    < > = values in this interval not displayed.  -Check Anemia Panel in the AM -Continue to Monitor on progressive unit with  telemetry -Avoid blood thinners -Recently had a CT Chest/Abdomen/Pelvis -Clear liquid diet for now and diet advancement per GI  -Trend hemoglobin every 8 hours -GI consulted and recommending conservative management  -Will need a Stat CT bleeding study in case of profuse bleeding   Acute Blood Loss Anemia -As above   Weight Loss -Patient's wife notes he has lost about 20 pounds this year, he has very low appetite. -CT Chest/Abd/Pelvis obtained outpatient and pending read -RD consultation -PT/OT evaluated and recommending SNF  Generalized Weakness -PT/OT recommending SNF  Hx of Prostate Cancer -Follow up with Oncology/Urology as an outpatient  -PCP recently checking PSA  Constipation -Initiate Bowel Regiment if necessary -GI Following and he had a recent digital fecal disimpaction  Mitral Valve Prolapse -Noted  Hypoalbuminemia -Patient's Albumin Trend: Recent Labs  Lab 10/15/23 1635 10/16/23 1009  ALBUMIN 3.6 3.1*  -Continue to Monitor and Trend and repeat CMP in the AM   DVT prophylaxis: SCDs Start: 10/15/23 1823    Code Status: Full Code Family Communication: Discussed with the patient's wife over the telephone  Disposition Plan:  Level of care: Progressive Status is: Inpatient Remains inpatient appropriate because: Needs further GI Clearance   Consultants:  Gastroenterology   Procedures:  As delineated as above   Antimicrobials:  Anti-infectives (From admission, onward)    None       Subjective: Seen and examined at bedside and he was sitting up in the bed and resting.  Appears  very fatigued and weak.  States that he still having some bloody bowel movements.  No other concerns or complaints at this time.  Objective: Vitals:   10/16/23 0641 10/16/23 0645 10/16/23 0659 10/16/23 1104  BP: 116/62 (!) 87/64 (!) 123/57 129/64  Pulse: 80 88 70 68  Resp:   18 16  Temp:   97.6 F (36.4 C) 98.6 F (37 C)  TempSrc:   Oral Oral  SpO2:   99% 99%   Weight:      Height:        Intake/Output Summary (Last 24 hours) at 10/16/2023 2025 Last data filed at 10/16/2023 1823 Gross per 24 hour  Intake 240 ml  Output 625 ml  Net -385 ml   Filed Weights   10/15/23 1613  Weight: 63.5 kg   Examination: Physical Exam:  Constitutional: Thin elderly Caucasian male in no acute distress Respiratory: Diminished to auscultation bilaterally, no wheezing, rales, rhonchi or crackles. Normal respiratory effort and patient is not tachypenic. No accessory muscle use.  Unlabored breathing Cardiovascular: RRR, and has no appreciable lower extremity edema Abdomen: Soft, non-tender, non-distended. Bowel sounds positive.  GU: Deferred. Musculoskeletal: No clubbing / cyanosis of digits/nails. No joint deformity upper and lower extremities.  Neurologic: CN 2-12 grossly intact with no focal deficits. Romberg sign and cerebellar reflexes not assessed.  Psychiatric: Is awake and alert and oriented and has a pleasant mood and affect  Data Reviewed: I have personally reviewed following labs and imaging studies  CBC: Recent Labs  Lab 10/15/23 1635 10/15/23 1848 10/16/23 0220 10/16/23 1009 10/16/23 1856  WBC 9.3  --   --  7.2  --   NEUTROABS  --   --   --  5.0  --   HGB 11.4* 9.5* 9.2* 9.3* 8.1*  HCT 35.3* 30.3* 28.5* 29.4* 25.5*  MCV 97.8  --   --  98.7  --   PLT 224  --   --  214  --    Basic Metabolic Panel: Recent Labs  Lab 10/14/23 1911 10/15/23 1635 10/16/23 1009  NA  --  140 139  K  --  3.9 3.8  CL  --  107 109  CO2  --  24 23  GLUCOSE  --  136* 106*  BUN  --  26* 27*  CREATININE 1.00 0.93 0.78  CALCIUM  --  8.7* 8.4*  MG  --   --  2.1  PHOS  --   --  3.5   GFR: Estimated Creatinine Clearance: 57.3 mL/min (by C-G formula based on SCr of 0.78 mg/dL). Liver Function Tests: Recent Labs  Lab 10/15/23 1635 10/16/23 1009  AST 18 15  ALT 12 11  ALKPHOS 53 42  BILITOT 0.7 0.7  PROT 6.4* 5.4*  ALBUMIN 3.6 3.1*   No results for  input(s): "LIPASE", "AMYLASE" in the last 168 hours. No results for input(s): "AMMONIA" in the last 168 hours. Coagulation Profile: Recent Labs  Lab 10/15/23 1630  INR 1.0   Cardiac Enzymes: No results for input(s): "CKTOTAL", "CKMB", "CKMBINDEX", "TROPONINI" in the last 168 hours. BNP (last 3 results) No results for input(s): "PROBNP" in the last 8760 hours. HbA1C: No results for input(s): "HGBA1C" in the last 72 hours. CBG: No results for input(s): "GLUCAP" in the last 168 hours. Lipid Profile: No results for input(s): "CHOL", "HDL", "LDLCALC", "TRIG", "CHOLHDL", "LDLDIRECT" in the last 72 hours. Thyroid Function Tests: No results for input(s): "TSH", "T4TOTAL", "FREET4", "T3FREE", "THYROIDAB" in the last 72  hours. Anemia Panel: No results for input(s): "VITAMINB12", "FOLATE", "FERRITIN", "TIBC", "IRON", "RETICCTPCT" in the last 72 hours. Sepsis Labs: No results for input(s): "PROCALCITON", "LATICACIDVEN" in the last 168 hours.  No results found for this or any previous visit (from the past 240 hour(s)).   Radiology Studies: No results found.   LOS: 0 days   Marguerita Merles, DO [facility] Available via Epic secure chat 7am-7pm After these hours, please refer to coverage provider listed on [email or web address] 10/16/2023, 8:25 PM

## 2023-10-17 ENCOUNTER — Encounter (HOSPITAL_COMMUNITY): Payer: Self-pay | Admitting: Internal Medicine

## 2023-10-17 ENCOUNTER — Inpatient Hospital Stay (HOSPITAL_COMMUNITY): Payer: Medicare Other

## 2023-10-17 DIAGNOSIS — K625 Hemorrhage of anus and rectum: Secondary | ICD-10-CM | POA: Diagnosis not present

## 2023-10-17 DIAGNOSIS — E43 Unspecified severe protein-calorie malnutrition: Secondary | ICD-10-CM | POA: Insufficient documentation

## 2023-10-17 LAB — PREPARE RBC (CROSSMATCH)

## 2023-10-17 LAB — CBC WITH DIFFERENTIAL/PLATELET
Abs Immature Granulocytes: 0.02 10*3/uL (ref 0.00–0.07)
Basophils Absolute: 0 10*3/uL (ref 0.0–0.1)
Basophils Relative: 0 %
Eosinophils Absolute: 0.2 10*3/uL (ref 0.0–0.5)
Eosinophils Relative: 2 %
HCT: 24.4 % — ABNORMAL LOW (ref 39.0–52.0)
Hemoglobin: 7.7 g/dL — ABNORMAL LOW (ref 13.0–17.0)
Immature Granulocytes: 0 %
Lymphocytes Relative: 28 %
Lymphs Abs: 2.1 10*3/uL (ref 0.7–4.0)
MCH: 31.7 pg (ref 26.0–34.0)
MCHC: 31.6 g/dL (ref 30.0–36.0)
MCV: 100.4 fL — ABNORMAL HIGH (ref 80.0–100.0)
Monocytes Absolute: 0.7 10*3/uL (ref 0.1–1.0)
Monocytes Relative: 9 %
Neutro Abs: 4.6 10*3/uL (ref 1.7–7.7)
Neutrophils Relative %: 61 %
Platelets: 188 10*3/uL (ref 150–400)
RBC: 2.43 MIL/uL — ABNORMAL LOW (ref 4.22–5.81)
RDW: 12.9 % (ref 11.5–15.5)
WBC: 7.6 10*3/uL (ref 4.0–10.5)
nRBC: 0 % (ref 0.0–0.2)

## 2023-10-17 LAB — PHOSPHORUS: Phosphorus: 3.6 mg/dL (ref 2.5–4.6)

## 2023-10-17 LAB — COMPREHENSIVE METABOLIC PANEL
ALT: 9 U/L (ref 0–44)
AST: 14 U/L — ABNORMAL LOW (ref 15–41)
Albumin: 3 g/dL — ABNORMAL LOW (ref 3.5–5.0)
Alkaline Phosphatase: 41 U/L (ref 38–126)
Anion gap: 6 (ref 5–15)
BUN: 28 mg/dL — ABNORMAL HIGH (ref 8–23)
CO2: 20 mmol/L — ABNORMAL LOW (ref 22–32)
Calcium: 8.2 mg/dL — ABNORMAL LOW (ref 8.9–10.3)
Chloride: 112 mmol/L — ABNORMAL HIGH (ref 98–111)
Creatinine, Ser: 0.94 mg/dL (ref 0.61–1.24)
GFR, Estimated: >60 mL/min (ref >60)
Glucose, Bld: 105 mg/dL — ABNORMAL HIGH (ref 70–99)
Potassium: 3.9 mmol/L (ref 3.5–5.1)
Sodium: 138 mmol/L (ref 135–145)
Total Bilirubin: 0.6 mg/dL (ref 0.3–1.2)
Total Protein: 5.1 g/dL — ABNORMAL LOW (ref 6.5–8.1)

## 2023-10-17 LAB — HEMOGLOBIN AND HEMATOCRIT, BLOOD
HCT: 23.9 % — ABNORMAL LOW (ref 39.0–52.0)
HCT: 25.9 % — ABNORMAL LOW (ref 39.0–52.0)
HCT: 28.5 % — ABNORMAL LOW (ref 39.0–52.0)
Hemoglobin: 7.5 g/dL — ABNORMAL LOW (ref 13.0–17.0)
Hemoglobin: 8.5 g/dL — ABNORMAL LOW (ref 13.0–17.0)
Hemoglobin: 9.5 g/dL — ABNORMAL LOW (ref 13.0–17.0)

## 2023-10-17 LAB — MAGNESIUM: Magnesium: 2.1 mg/dL (ref 1.7–2.4)

## 2023-10-17 MED ORDER — PANTOPRAZOLE SODIUM 40 MG IV SOLR
40.0000 mg | Freq: Two times a day (BID) | INTRAVENOUS | Status: DC
  Administered 2023-10-17 – 2023-10-22 (×11): 40 mg via INTRAVENOUS
  Filled 2023-10-17 (×11): qty 10

## 2023-10-17 MED ORDER — BOOST / RESOURCE BREEZE PO LIQD CUSTOM
1.0000 | Freq: Three times a day (TID) | ORAL | Status: DC
  Administered 2023-10-17 – 2023-10-21 (×11): 1 via ORAL

## 2023-10-17 MED ORDER — IOHEXOL 350 MG/ML SOLN
100.0000 mL | Freq: Once | INTRAVENOUS | Status: AC | PRN
  Administered 2023-10-17: 100 mL via INTRAVENOUS

## 2023-10-17 MED ORDER — SODIUM CHLORIDE 0.9% IV SOLUTION: Freq: Once | INTRAVENOUS | Status: AC

## 2023-10-17 NOTE — Progress Notes (Signed)
PROGRESS NOTE    Aaron Harmon  UUV:[id number] DOB: August 11, 1933 DOA: 10/15/2023 PCP: [name], MD   Brief Narrative:  HPI per Dr. 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  He has a known history of diverticulosis, recently he has been constipated and says that he manually disimpacted himself a couple of days ago, after which she has had bright red blood per rectum intermittently for the last 3 days.  He had some slight left lower quadrant abdominal pain a couple of days ago, but this resolved with bowel movement.  He takes no blood thinners, today started to come to the ER for evaluation because he got orthostatic this afternoon when he stood up.  Due to the abdominal pain, outpatient CT scan was performed yesterday, but this has not been interpreted.   ED Course: During the emergency department, he has been afebrile blood pressure 127/69.  Saturating 100% on room air.  Lab work was reviewed, significant for normal INR, and hemoglobin down to 12 from 14.2 on 10/10.  **Interim History  GI evaluated and recommending conservative management and recommending continuing to Monitor H/H and have placed patient on CLD.  Patient continues to bleed and was having active bloody bowel movements so GI ordered a stat CTA of the abdomen pelvis to try and localize bleed.  Interventional radiology evaluated and imaging was reviewed and no extravasation was identified so there is no IR planned procedure for further evaluation.  Will type and screen and transfuse 2 more units and obtain palliative care consultation for further goals of care discussion  Assessment and Plan:  Bright Red Blood Per Rectum and Acute Blood Loss Anemia -This is likely diverticular bleeding, but differential also includes AVMs, internal/external hemorrhoids,  etc. -Obs Admission changed to Inpatient Progressive  -Hgb/Hct Trend: Recent Labs  Lab 10/15/23 1848 10/16/23 0220 10/16/23 1009 10/16/23 1856 10/17/23 0507 10/17/23 1017 10/17/23 1459  HGB 9.5* 9.2* 9.3* 8.1* 7.7* 7.5* 8.5*  HCT 30.3* 28.5* 29.4* 25.5* 24.4* 23.9* 25.9*  MCV  --   --  98.7  --  100.4*  --   --   -Will not check Anemia Panel now that he has received blood transfusions -Continue to Monitor on progressive unit with telemetry -Avoid blood thinners -Recently had a CT Chest/Abdomen/Pelvis but will get a stat CTA Bleeding Scan -CTA done and showed "Asymmetric focal enhancement in the proximal descending colon. Although there is no pooling blood, this may reflect focal edema and intermittent hemorrhage. No other source of hemorrhage is identified. Coronary artery disease. 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 Stable bilateral renal cysts. Stable hepatic cysts.. Stable simple cyst in the superior aspect of the spleen. Aortic Atherosclerosis." -Clear liquid diet for now and diet advancement per GI  -Trend hemoglobin every 8 hours -GI consulted and recommending conservative management  -Will type and screen and transfuse 2 units given his active bleeding -Will neet to continue to Monitor for Active Bleeding    Acute Blood Loss Anemia -As above   Weight Loss -Patient's wife notes he has lost about 20 pounds this year, he has very low appetite. -CT Chest/Abd/Pelvis obtained outpatient and showed "No acute abnormality in the chest, abdomen, or pelvis. No pleural effusion or explanation for dyspnea. Staghorn calculus in the left renal pelvis measuring 2.8 x 2 cm.  There is mild chronic fullness of the calices but no frank hydronephrosis or renal inflammation. Colonic diverticulosis without diverticulitis. Moderate colonic stool burden with scattered areas of fecalization of small bowel contents, suggesting slow transit. No liquid stool or colonic inflammation.  Aortic Atherosclerosis" -RD consultation -PT/OT evaluated and recommending SNF  Generalized Weakness -PT/OT recommending SNF  Hx of Prostate Cancer -Follow up with Oncology/Urology as an outpatient  -PCP recently checking PSA  Constipation -Having a lot of bloody bowel movements -GI Following and he had a recent digital fecal disimpaction  Mitral Valve Prolapse -Noted  Metabolic Acidosis -Mild. CO2 is now 20, AG is 6, Chloride Level is 112 -Continue to Monitor and Trend and repeat CMP in the AM   Severe Malnutrition in the Context of Chronic illness -Nutrition Status: Nutrition Problem: Severe Malnutrition Etiology: chronic illness Signs/Symptoms: meal completion < 25%, estimated needs, percent weight loss Percent weight loss: 9.8 % (x 18 days) Interventions: Boost Breeze  Hypoalbuminemia -Patient's Albumin Trend: Recent Labs  Lab 10/15/23 1635 10/16/23 1009 10/17/23 0507  ALBUMIN 3.6 3.1* 3.0*  -Continue to Monitor and Trend and repeat CMP in the AM   DVT prophylaxis: SCDs Start: 10/15/23 1823    Code Status: Limited: Do not attempt resuscitation (DNR) -DNR-LIMITED -Do Not Intubate/DNI  Family Communication: Discussed with wife at bedside   Disposition Plan:  Level of care: Progressive Status is: Inpatient Remains inpatient appropriate because: Needs further clinical improvement and clearance by GI and Bleeding needs to stop  Consultants:  Gastroenterology Discussed with Interventional Radiology Palliative Care Medicine  Procedures:  As delineated as above   Antimicrobials:  Anti-infectives (From admission, onward)    None       Subjective: Seen and examined at bedside and was feeling very weak and having GI bleeding.  Had a bloody bowel movement when I was in the room.  Denied any nausea or vomiting.  Surprised that he had a staghorn calculus and did admit to having some left-sided pain prior to admission.  No other concerns or complaints at this  time.  Objective: Vitals:   10/17/23 1031 10/17/23 1348 10/17/23 1749 10/17/23 1815  BP: 119/72 130/67 118/63 131/70  Pulse: 79 66 71 73  Resp: 15 15 15 15   Temp: 97.7 F (36.5 C) 97.9 F (36.6 C) 98 F (36.7 C) 98.4 F (36.9 C)  TempSrc: Oral Oral Oral Oral  SpO2: 100% 100% 100% 98%  Weight:      Height:        Intake/Output Summary (Last 24 hours) at 10/17/2023 1929 Last data filed at 10/17/2023 1400 Gross per 24 hour  Intake 544 ml  Output 0 ml  Net 544 ml   Filed Weights   10/15/23 1613  Weight: 63.5 kg   Examination: Physical Exam:  Constitutional: Thin elderly Caucasian male who appears slightly uncomfortable Respiratory: Diminished to auscultation bilaterally, no wheezing, rales, rhonchi or crackles. Normal respiratory effort and patient is not tachypenic. No accessory muscle use.  Unlabored breathing Cardiovascular: RRR, no murmurs / rubs / gallops. S1 and S2 auscultated. No extremity edema.  Abdomen: Soft, non-tender, non-distended. Bowel sounds positive.  GU: Deferred. Musculoskeletal: No clubbing / cyanosis of digits/nails. No joint deformity upper and lower extremities.  Skin: No rashes, lesions, ulcers on limited skin evaluation. No induration; Warm and dry.  Neurologic: CN 2-12 grossly intact with no focal deficits. Romberg sign and cerebellar reflexes not assessed.  Psychiatric: Normal judgment and insight. Alert and oriented x 3.   Data Reviewed:  I have personally reviewed following labs and imaging studies  CBC: Recent Labs  Lab 10/15/23 1635 10/15/23 1848 10/16/23 1009 10/16/23 1856 10/17/23 0507 10/17/23 1017 10/17/23 1459  WBC 9.3  --  7.2  --  7.6  --   --   NEUTROABS  --   --  5.0  --  4.6  --   --   HGB 11.4*   < > 9.3* 8.1* 7.7* 7.5* 8.5*  HCT 35.3*   < > 29.4* 25.5* 24.4* 23.9* 25.9*  MCV 97.8  --  98.7  --  100.4*  --   --   PLT 224  --  214  --  188  --   --    < > = values in this interval not displayed.   Basic Metabolic  Panel: Recent Labs  Lab 10/14/23 1911 10/15/23 1635 10/16/23 1009 10/17/23 0507  NA  --  140 139 138  K  --  3.9 3.8 3.9  CL  --  107 109 112*  CO2  --  24 23 20*  GLUCOSE  --  136* 106* 105*  BUN  --  26* 27* 28*  CREATININE 1.00 0.93 0.78 0.94  CALCIUM  --  8.7* 8.4* 8.2*  MG  --   --  2.1 2.1  PHOS  --   --  3.5 3.6   GFR: Estimated Creatinine Clearance: 48.8 mL/min (by C-G formula based on SCr of 0.94 mg/dL). Liver Function Tests: Recent Labs  Lab 10/15/23 1635 10/16/23 1009 10/17/23 0507  AST 18 15 14*  ALT 12 11 9   ALKPHOS 53 42 41  BILITOT 0.7 0.7 0.6  PROT 6.4* 5.4* 5.1*  ALBUMIN 3.6 3.1* 3.0*   No results for input(s): "LIPASE", "AMYLASE" in the last 168 hours. No results for input(s): "AMMONIA" in the last 168 hours. Coagulation Profile: Recent Labs  Lab 10/15/23 1630  INR 1.0   Cardiac Enzymes: No results for input(s): "CKTOTAL", "CKMB", "CKMBINDEX", "TROPONINI" in the last 168 hours. BNP (last 3 results) No results for input(s): "PROBNP" in the last 8760 hours. HbA1C: No results for input(s): "HGBA1C" in the last 72 hours. CBG: No results for input(s): "GLUCAP" in the last 168 hours. Lipid Profile: No results for input(s): "CHOL", "HDL", "LDLCALC", "TRIG", "CHOLHDL", "LDLDIRECT" in the last 72 hours. Thyroid Function Tests: No results for input(s): "TSH", "T4TOTAL", "FREET4", "T3FREE", "THYROIDAB" in the last 72 hours. Anemia Panel: No results for input(s): "VITAMINB12", "FOLATE", "FERRITIN", "TIBC", "IRON", "RETICCTPCT" in the last 72 hours. Sepsis Labs: No results for input(s): "PROCALCITON", "LATICACIDVEN" in the last 168 hours.  No results found for this or any previous visit (from the past 240 hour(s)).   Radiology Studies: CT ANGIO GI BLEED  Result Date: 10/17/2023 CLINICAL DATA:  Lower GI bleed. EXAM: CTA ABDOMEN AND PELVIS WITHOUT AND WITH CONTRAST TECHNIQUE: Multidetector CT imaging of the abdomen and pelvis was performed using the  standard protocol during bolus administration of intravenous contrast. Multiplanar reconstructed images and MIPs were obtained and reviewed to evaluate the vascular anatomy. RADIATION DOSE REDUCTION: This exam was performed according to the departmental dose-optimization program which includes automated exposure control, adjustment of the mA and/or kV according to patient size and/or use of iterative reconstruction technique. CONTRAST:  OMNIPAQUE IOHEXOL 350 MG/ML SOLN COMPARISON:  CT of the abdomen and pelvis 10/14/2023 FINDINGS: VASCULAR Aorta: Diffuse atherosclerotic irregularity is present throughout the abdominal aorta. No aneurysm or stenosis is present. Celiac: Mild narrowing of the proximal celiac artery  measures 3 mm, approximately 50% of the caliber of the more distal vessel. The branch vessels are within normal limits. SMA: SMA origin is within normal limits. Atherosclerotic disease is present with 50% narrowing more distally secondary to noncalcified plaque. Renals: Mild calcifications are present proximally without significant stenosis in the renal arteries. IMA: A moderate proximal stenosis is present. The distal branch vessels are within normal limits. Inflow: Atherosclerotic changes are present without focal stenosis. Proximal Outflow: Atherosclerotic changes are present. No focal stenosis or aneurysm is present. Veins: Within normal limits. The arterial and delayed portal venous phase images demonstrate asymmetric focal enhancement in the proximal descending colon. No pooling contrast is evident. Review of the MIP images confirms the above findings. NON-VASCULAR Lower chest: The lung bases are clear. Coronary artery calcifications are present. Hepatobiliary: Cystic lesions are present in the liver bilaterally, measuring 11 mm in the left lobe and 7 mm in the right lobe. The gallbladder and common bile duct are within normal limits. Pancreas: Unremarkable. No pancreatic ductal dilatation or  surrounding inflammatory changes. Spleen: A 2.7 cm simple cyst is present in the superior aspect of the spleen. Adrenals/Urinary Tract: The adrenal glands are normal bilaterally. Bilateral renal cysts are again noted. A staghorn calculus of the left renal collecting system is stable. The distal ureters are within normal limits bilaterally. The urinary bladder is normal. Stomach/Bowel: Stomach and duodenum are within normal limits. The small bowel is unremarkable. Terminal ileum is within normal limits. The appendix is visualized and normal. The ascending and transverse colon are normal. Diverticular changes are present in the distal descending and sigmoid colon. No inflammatory changes are present to suggest diverticulitis. Lymphatic: No significant adenopathy is present. Reproductive: Prostate is unremarkable. Other: No abdominal wall hernia or abnormality. No abdominopelvic ascites. Musculoskeletal: Grade 1 anterolisthesis at L4-5 is stable. The bony pelvis is normal. The hips are located and within normal limits bilaterally. IMPRESSION: 1. Asymmetric focal enhancement in the proximal descending colon. Although there is no pooling blood, this may reflect focal edema and intermittent hemorrhage. No other source of hemorrhage is identified. 2. Coronary artery disease. 3. 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 4. Stable bilateral renal cysts. 5. Stable hepatic cysts. 6. Stable simple cyst in the superior aspect of the spleen. 7.  Aortic Atherosclerosis (ICD10-I70.0). Electronically Signed   By: Marin Roberts M.D.   On: 10/17/2023 13:33    Scheduled Meds:  feeding supplement  1 Container Oral TID BM   pantoprazole (PROTONIX) IV  40 mg Intravenous Q12H   Continuous Infusions:   LOS: 1 day   Marguerita Merles, DO [facility] Available via Epic secure chat 7am-7pm After these hours, please refer to coverage provider listed on [email or web address] 10/17/2023, 7:29 PM

## 2023-10-17 NOTE — Consult Note (Signed)
Consultation Note Date: 10/17/2023   Patient Name: Aaron Harmon  DOB: 11/17/1933  MRN: [id number]  Age / Sex: 89 y.o., male  PCP: [name], MD Referring Physician: Merlene Laughter, DO  Reason for Consultation: Establishing goals of care  HPI/Patient Profile: 89 y.o. 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  CT chest abdomen pelvis showed colonic diverticulosis.  Had colonoscopy in January 2023 showing diverticulosis in sigmoid descending transverse colon and multiple tubular adenomas were removed at that time.  Patient lives at [facility] with his wife and independent living.  Patient had a colonic diverticular GI bleed a couple of years ago as well. Palliative consult for additional support has been requested Chart reviewed, discussed with [facility] physician Patient seen, wife present at bedside.  She asks for purpose of palliative services and why we were consulted.  Introduced myself and palliative care as follows: Palliative medicine is specialized medical care for people living with serious illness. It focuses on providing relief from the symptoms and stress of a serious illness. The goal is to improve quality of life for both the patient and the family. Goals of care: Broad aims of medical therapy in relation to the patient's values and preferences. Our aim is to provide medical care aimed at enabling patients to achieve the goals that matter most to them, given the circumstances of their particular medical situation and their constraints.   Chart reviewed.  Patient was resting peacefully.  He was awake and alert when a colleague came in from the lab to check a hemoglobin level.  Patient and wife live in the independent living section at [facility].  2  years ago he had colonic diverticulosis disorder as well.  He is established with Dr. Marca Ancona in gastroenterology. He is a retired Development worker, community.  At baseline he uses a walker.  Patient was noted to have gotten orthostatic at his facility and was brought into the emergency department for further evaluation.  Patient had had constipation recently and was placed on bowel regimen in the [facility] facility. Patient was admitted to hospital medicine service for bright red blood per rectum and acute blood loss anemia.  Noted to be likely diverticular in etiology.  CT scan done.  GI also saw.  Hemoglobin is being monitored serially.  Packed red blood cell transfusions are being given.  Palliative care consulted for additional support.  Goals of care discussions undertaken.  See below. NEXT OF KIN  Wife and daughter.   SUMMARY OF RECOMMENDATIONS   Agree with DNR Continue current mode of care Chart reviewed, discussed with the patient's wife present at the bedside.  Goals are not for comfort measures only.  Patient and family hopeful for ongoing stabilization/recovery.  They wish to continue current mode of care.  Wife requests for [facility] consultation to see about additional services at [facility]  monitor hospital course, continue with PRBC.  Continue  to explore if scope for supportive services in the outpatient setting. Thank you for the consult.  Code Status/Advance Care Planning: DNR   Symptom Management:     Palliative Prophylaxis:  On PPI  Psycho-social/Spiritual:  Desire for further Chaplaincy support:no Additional Recommendations: Caregiving  Support/Resources  Prognosis:  Unable to determine  Discharge Planning: To Be Determined      Primary Diagnoses: Present on Admission:  BRBPR (bright red blood per rectum)   I have reviewed the medical record, interviewed the patient and family, and examined the patient. The following aspects are pertinent.  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    after prostate infection   Mitral valve prolapse 1985   after strenous activity   Prostate cancer (HCC)    Zenker's diverticulum    Social History   Socioeconomic History   Marital status: Married    Spouse name: Not on file   Number of children: Not on file   Years of education: Not on file   Highest education level: Not on file  Occupational History   Occupation: Physician    Comment: retired Development worker, international aid  Tobacco Use   Smoking status: Never   Smokeless tobacco: Never  Vaping Use   Vaping status: Never Used  Substance and Sexual Activity   Alcohol use: Never   Drug use: Never   Sexual activity: Not on file  Other Topics Concern   Not on file  Social History Narrative   Diet is normal   Rarely eat /drinks things with caffeine   Lives in an apartment on 3rd floor-2 people in home.    Has a cat.    Past profession was a Physician   Has Living Will and DNR   Social Determinants of Health   Financial Resource Strain: Not on file  Food Insecurity: No Food Insecurity (10/15/2023)   Hunger Vital Sign    Worried About Running Out of Food in the Last Year: Never true    Ran Out of Food in the Last Year: Never true  Transportation Needs: No Transportation Needs (10/15/2023)   PRAPARE - Administrator, Civil Service (Medical): No    Lack of Transportation (Non-Medical): No  Physical Activity: Not on file  Stress: Not on file  Social Connections: Not on file   History reviewed. No pertinent family history. Scheduled Meds:  sodium chloride   Intravenous Once   feeding supplement  1 Container Oral TID BM   pantoprazole (PROTONIX) IV  40 mg Intravenous Q12H   Continuous Infusions: PRN Meds:.acetaminophen **OR** acetaminophen, albuterol, ondansetron **OR** ondansetron (ZOFRAN) IV, mouth rinse, traZODone Medications Prior to Admission:  Prior to Admission medications   Medication Sig Start  Date End Date Taking? Authorizing Provider  polyethylene glycol (MIRALAX / GLYCOLAX) 17 g packet Take 17 g by mouth daily as needed for mild constipation.   Yes [provider]  senna (SENOKOT) 8.6 MG TABS tablet Take 1 tablet by mouth at bedtime as needed for mild constipation.   Yes [provider]  TYLENOL 500 MG tablet Take 500 mg by mouth every 6 (six) hours as needed for mild pain (pain score 1-3) or headache.   Yes [provider]   Allergies  Allergen Reactions   Ciprofloxacin Other (See Comments)    Reaction not confirmed   Review of Systems +weakness Physical Exam Elderly gentleman resting in bed Appears to have regular work of breathing Resting in bed  throughout this initial encounter, wife primary historian No edema Awakens easily and responds appropriately when ancillary staff enters the room  Vital Signs: BP 130/67   Pulse 66   Temp 97.9 F (36.6 C) (Oral)   Resp 15   Ht 6\' 1"  (1.854 m)   Wt 63.5 kg   SpO2 100%   BMI 18.47 kg/m  Pain Scale: 0-10 POSS *See Group Information*: 1-Acceptable,Awake and alert Pain Score: 0-No pain   SpO2: SpO2: 100 % O2 Device:SpO2: 100 % O2 Flow Rate: .   IO: Intake/output summary:  Intake/Output Summary (Last 24 hours) at 10/17/2023 1509 Last data filed at 10/17/2023 1348 Gross per 24 hour  Intake 544 ml  Output 450 ml  Net 94 ml    LBM: Last BM Date : 10/17/23 Baseline Weight: Weight: 63.5 kg Most recent weight: Weight: 63.5 kg     Palliative Assessment/Data:   PPS 50%  Time In:  1410 Time Out:  1510 Time Total:  60  Greater than 50%  of this time was spent counseling and coordinating care related to the above assessment and plan.  Signed by: Rosalin Hawking, MD   Please contact Palliative Medicine Team phone at [phone] for questions and concerns.  For individual provider: See Loretha Stapler

## 2023-10-17 NOTE — Progress Notes (Signed)
Subjective: Patient continues to have multiple episodes of dark red, maroon liquid stool per rectum.  Objective: Vital signs in last 24 hours: Temp:  [97.4 F (36.3 C)-98.2 F (36.8 C)] 97.7 F (36.5 C) (10/20 1031) Pulse Rate:  [64-79] 79 (10/20 1031) Resp:  [14-18] 15 (10/20 1031) BP: (105-119)/(46-72) 119/72 (10/20 1031) SpO2:  [98 %-100 %] 100 % (10/20 1031) Weight change:  Last BM Date : 10/17/23  PE: Pale GENERAL: Not in distress, alert, awake, oriented x 3  ABDOMEN: Nondistended EXTREMITIES: No deformity  Lab Results: Results for orders placed or performed during the hospital encounter of 10/15/23 (from the past 48 hour(s))  Type and screen [facility]     Status: None (Preliminary result)   Collection Time: 10/15/23  4:30 PM  Result Value Ref Range   ABO/RH(D) O NEG    Antibody Screen NEG    Sample Expiration 10/18/2023,2359    Unit Number [id number]    Blood Component Type RBC, LR IRR    Unit division 00    Status of Unit ISSUED    Transfusion Status OK TO TRANSFUSE    Crossmatch Result      Compatible Performed at [facility], [address], [place], [place] [address]   Protime-INR     Status: None   Collection Time: 10/15/23  4:30 PM  Result Value Ref Range   Prothrombin Time 12.9 11.4 - 15.2 seconds   INR 1.0 0.8 - 1.2    Comment: (NOTE) INR goal varies based on device and disease states. Performed at [facility], [address], [place], [place] [address]   Comprehensive metabolic panel     Status: Abnormal   Collection Time: 10/15/23  4:35 PM  Result Value Ref Range   Sodium 140 135 - 145 mmol/L   Potassium 3.9 3.5 - 5.1 mmol/L   Chloride 107 98 - 111 mmol/L   CO2 24 22 - 32 mmol/L   Glucose, Bld 136 (H) 70 - 99 mg/dL    Comment: Glucose reference range applies only to samples taken after fasting for at least 8 hours.   BUN 26 (H) 8 - 23 mg/dL   Creatinine, Ser 4.13 0.61 - 1.24 mg/dL    Calcium 8.7 (L) 8.9 - 10.3 mg/dL   Total Protein 6.4 (L) 6.5 - 8.1 g/dL   Albumin 3.6 3.5 - 5.0 g/dL   AST 18 15 - 41 U/L   ALT 12 0 - 44 U/L   Alkaline Phosphatase 53 38 - 126 U/L   Total Bilirubin 0.7 0.3 - 1.2 mg/dL   GFR, Estimated >24 >40 mL/min    Comment: (NOTE) Calculated using the CKD-EPI Creatinine Equation (2021)    Anion gap 9 5 - 15    Comment: Performed at [facility], [address] [place], [place] [address]  CBC     Status: Abnormal   Collection Time: 10/15/23  4:35 PM  Result Value Ref Range   WBC 9.3 4.0 - 10.5 K/uL   RBC 3.61 (L) 4.22 - 5.81 MIL/uL   Hemoglobin 11.4 (L) 13.0 - 17.0 g/dL   HCT 53.6 (L) 64.4 - 03.4 %   MCV 97.8 80.0 - 100.0 fL   MCH 31.6 26.0 - 34.0 pg   MCHC 32.3 30.0 - 36.0 g/dL   RDW 74.2 59.5 - 63.8 %   Platelets 224 150 - 400 K/uL   nRBC 0.0 0.0 - 0.2 %    Comment:  Performed at [facility], [address] [place], [place] [address]  Hemoglobin and hematocrit, blood     Status: Abnormal   Collection Time: 10/15/23  6:48 PM  Result Value Ref Range   Hemoglobin 9.5 (L) 13.0 - 17.0 g/dL   HCT 21.3 (L) 08.6 - 57.8 %    Comment: Performed at [facility], [address] [place], [place] [address]  Hemoglobin and hematocrit, blood     Status: Abnormal   Collection Time: 10/16/23  2:20 AM  Result Value Ref Range   Hemoglobin 9.2 (L) 13.0 - 17.0 g/dL   HCT 95.2 (L) 84.1 - 32.4 %    Comment: Performed at [facility], [address], [place], [place] [address]  CBC with Differential/Platelet     Status: Abnormal   Collection Time: 10/16/23 10:09 AM  Result Value Ref Range   WBC 7.2 4.0 - 10.5 K/uL   RBC 2.98 (L) 4.22 - 5.81 MIL/uL   Hemoglobin 9.3 (L) 13.0 - 17.0 g/dL   HCT 72.5 (L) 36.6 - 44.0 %   MCV 98.7 80.0 - 100.0 fL   MCH 31.2 26.0 - 34.0 pg   MCHC 31.6 30.0 - 36.0 g/dL   RDW 34.7 42.5 - 95.6 %   Platelets 214 150 - 400 K/uL   nRBC 0.0 0.0 - 0.2 %    Neutrophils Relative % 70 %   Neutro Abs 5.0 1.7 - 7.7 K/uL   Lymphocytes Relative 22 %   Lymphs Abs 1.6 0.7 - 4.0 K/uL   Monocytes Relative 7 %   Monocytes Absolute 0.5 0.1 - 1.0 K/uL   Eosinophils Relative 1 %   Eosinophils Absolute 0.1 0.0 - 0.5 K/uL   Basophils Relative 0 %   Basophils Absolute 0.0 0.0 - 0.1 K/uL   Immature Granulocytes 0 %   Abs Immature Granulocytes 0.02 0.00 - 0.07 K/uL    Comment: Performed at [facility], [address], [place], [place] [address]  Comprehensive metabolic panel     Status: Abnormal   Collection Time: 10/16/23 10:09 AM  Result Value Ref Range   Sodium 139 135 - 145 mmol/L   Potassium 3.8 3.5 - 5.1 mmol/L   Chloride 109 98 - 111 mmol/L   CO2 23 22 - 32 mmol/L   Glucose, Bld 106 (H) 70 - 99 mg/dL    Comment: Glucose reference range applies only to samples taken after fasting for at least 8 hours.   BUN 27 (H) 8 - 23 mg/dL   Creatinine, Ser 4.33 0.61 - 1.24 mg/dL   Calcium 8.4 (L) 8.9 - 10.3 mg/dL   Total Protein 5.4 (L) 6.5 - 8.1 g/dL   Albumin 3.1 (L) 3.5 - 5.0 g/dL   AST 15 15 - 41 U/L   ALT 11 0 - 44 U/L   Alkaline Phosphatase 42 38 - 126 U/L   Total Bilirubin 0.7 0.3 - 1.2 mg/dL   GFR, Estimated >29 >51 mL/min    Comment: (NOTE) Calculated using the CKD-EPI Creatinine Equation (2021)    Anion gap 7 5 - 15    Comment: Performed at [facility], [address] [place], [place] [address]  Magnesium     Status: None   Collection Time: 10/16/23 10:09 AM  Result Value Ref Range   Magnesium 2.1 1.7 - 2.4 mg/dL    Comment: Performed at [facility], [address] [place], [place] [address]  Phosphorus     Status: None   Collection Time: 10/16/23 10:09 AM  Result Value Ref Range   Phosphorus 3.5 2.5 - 4.6 mg/dL    Comment: Performed at [facility], [address], [place], [place] [address]  Hemoglobin and hematocrit, blood     Status: Abnormal    Collection Time: 10/16/23  6:56 PM  Result Value Ref Range   Hemoglobin 8.1 (L) 13.0 - 17.0 g/dL   HCT 60.4 (L) 54.0 - 98.1 %    Comment: Performed at [facility], [address], [place], [place] [address]  Comprehensive metabolic panel     Status: Abnormal   Collection Time: 10/17/23  5:07 AM  Result Value Ref Range   Sodium 138 135 - 145 mmol/L   Potassium 3.9 3.5 - 5.1 mmol/L   Chloride 112 (H) 98 - 111 mmol/L   CO2 20 (L) 22 - 32 mmol/L   Glucose, Bld 105 (H) 70 - 99 mg/dL    Comment: Glucose reference range applies only to samples taken after fasting for at least 8 hours.   BUN 28 (H) 8 - 23 mg/dL   Creatinine, Ser 8.29 0.61 - 1.24 mg/dL   Calcium 8.2 (L) 8.9 - 10.3 mg/dL   Total Protein 5.1 (L) 6.5 - 8.1 g/dL   Albumin 3.0 (L) 3.5 - 5.0 g/dL   AST 14 (L) 15 - 41 U/L   ALT 9 0 - 44 U/L   Alkaline Phosphatase 41 38 - 126 U/L   Total Bilirubin 0.6 0.3 - 1.2 mg/dL   GFR, Estimated >56 >21 mL/min    Comment: (NOTE) Calculated using the CKD-EPI Creatinine Equation (2021)    Anion gap 6 5 - 15    Comment: Performed at [facility], [address] [place], [place] [address]  CBC with Differential/Platelet     Status: Abnormal   Collection Time: 10/17/23  5:07 AM  Result Value Ref Range   WBC 7.6 4.0 - 10.5 K/uL   RBC 2.43 (L) 4.22 - 5.81 MIL/uL   Hemoglobin 7.7 (L) 13.0 - 17.0 g/dL   HCT 78.4 (L) 69.6 - 29.5 %   MCV 100.4 (H) 80.0 - 100.0 fL   MCH 31.7 26.0 - 34.0 pg   MCHC 31.6 30.0 - 36.0 g/dL   RDW 28.4 13.2 - 44.0 %   Platelets 188 150 - 400 K/uL   nRBC 0.0 0.0 - 0.2 %   Neutrophils Relative % 61 %   Neutro Abs 4.6 1.7 - 7.7 K/uL   Lymphocytes Relative 28 %   Lymphs Abs 2.1 0.7 - 4.0 K/uL   Monocytes Relative 9 %   Monocytes Absolute 0.7 0.1 - 1.0 K/uL   Eosinophils Relative 2 %   Eosinophils Absolute 0.2 0.0 - 0.5 K/uL   Basophils Relative 0 %   Basophils Absolute 0.0 0.0 - 0.1 K/uL   Immature Granulocytes 0 %   Abs  Immature Granulocytes 0.02 0.00 - 0.07 K/uL    Comment: Performed at [facility], [address], [place], [place] [address]  Phosphorus     Status: None   Collection Time: 10/17/23  5:07 AM  Result Value Ref Range   Phosphorus 3.6 2.5 - 4.6 mg/dL    Comment: Performed at [facility] [address], [place], [place] [address]  Magnesium     Status: None   Collection Time: 10/17/23  5:07 AM  Result Value Ref Range  Magnesium 2.1 1.7 - 2.4 mg/dL    Comment: Performed at [facility], [address], [place], [place] [address]  Prepare RBC (crossmatch)     Status: None   Collection Time: 10/17/23  8:14 AM  Result Value Ref Range   Order Confirmation      ORDER PROCESSED BY BLOOD BANK Performed at [facility], [address] [place], [place] [address]     Studies/Results: No results found.  Medications: I have reviewed the patient's current medications.  Assessment: Painless hematochezia-ongoing with a drop in hemoglobin, now 7.7 Receiving 1 unit PRBC transfusion now CT chest, abdomen, pelvis from 10/16/2023 showed colonic diverticulosis Colonoscopy 01/24/2022 showed diverticulosis in sigmoid, descending and transverse and multiple tubular adenomas were removed  Plan: Patient is a DNR. Recommend CT angio abdomen stat, if active bleeding can be localized, recommend IR evaluation for embolization. Supportive management, H&H monitoring and transfusion as needed. Discussed the same in details with the patient and his wife at bedside.  Kerin Salen, MD 10/17/2023, 11:34 AM

## 2023-10-17 NOTE — Progress Notes (Signed)
Initial Nutrition Assessment  DOCUMENTATION CODES:   Severe malnutrition in context of chronic illness  INTERVENTION:  Boost Breeze po TID, each supplement provides 250 kcal and 9 grams of protein Advance diet as medically applicable.    NUTRITION DIAGNOSIS:   Severe Malnutrition related to chronic illness as evidenced by meal completion < 25%, estimated needs, percent weight loss.    GOAL:   Patient will meet greater than or equal to 90% of their needs    MONITOR:   PO intake, Diet advancement, Supplement acceptance, Labs, Weight trends  REASON FOR ASSESSMENT:   Consult Assessment of nutrition requirement/status  ASSESSMENT:89 y.o. M, admitted to the hospital with bright red blood per rectum.  Pmhx; Prostate cancer, Meningitis,Zenker's diverticulum. RDN reached out to Patient via phone with no answer. All information obtained through EMR and Team. Review of EMR revealed 10%weight  loss in 18 days. Declined oral intake, Sleeps frequently. PT evaluated and recommends SNF placement.   Admit weight: 63.5 kg Current weight: 63.5 kg  Weight history: 10/15/23 63.5 kg  09/28/23 70.3 kg  05/11/22 79.9 kg      Average Meal Intake: no documentation at this time  Nutritionally Relevant Medications: Scheduled Meds:  feeding supplement  1 Container Oral TID BM   pantoprazole (PROTONIX) IV  40 mg Intravenous Q12H   Continuous Infusions: PRN Meds:.acetaminophen **OR** acetaminophen, albuterol, ondansetron **OR** ondansetron (ZOFRAN) IV, mouth rinse, traZODone  Labs Reviewed: Chem7 labs reviewed CBG ranges from 106-105 mg/dL over the last 24 hours    NUTRITION - FOCUSED PHYSICAL EXAM:  Deferred  Diet Order:   Diet Order             Diet clear liquid Room service appropriate? Yes; Fluid consistency: Thin  Diet effective now                   EDUCATION NEEDS:   Not appropriate for education at this time  Skin:  Skin Assessment: Reviewed RN  Assessment  Last BM:  10/20  Height:   Ht Readings from Last 1 Encounters:  10/15/23 6\' 1"  (1.854 m)    Weight:   Wt Readings from Last 1 Encounters:  10/15/23 63.5 kg    Ideal Body Weight:     BMI:  Body mass index is 18.47 kg/m.  Estimated Nutritional Needs:   Kcal:  1900-2300 kcal/d  Protein:  85-95 g/d  Fluid:  12ml/kcal    Jamelle Haring RDN, LDN Clinical Dietitian  RDN pager # available on Amion

## 2023-10-17 NOTE — Plan of Care (Signed)

## 2023-10-17 NOTE — Progress Notes (Signed)
Pt had a total of 6 small to medium bloody Bms during the 7a-7p shift. Did not attempt to get out of bed due to dizziness. Night RN aware.

## 2023-10-17 NOTE — Progress Notes (Signed)
IR asked to evaluate patient for GI bleed. Imaging reviewed by Dr. Milford Cage - no active extravasation identified. No IR procedure planned and the order will be deleted. Dr. Milford Cage spoke with the ordering provider.   Alwyn Ren, [place] [phone] 10/17/2023, 2:01 PM

## 2023-10-17 NOTE — Progress Notes (Signed)
1 unit of RBCs ordered. Pt refused blood at this time. Will continue to monitor.

## 2023-10-18 DIAGNOSIS — E43 Unspecified severe protein-calorie malnutrition: Secondary | ICD-10-CM

## 2023-10-18 DIAGNOSIS — K625 Hemorrhage of anus and rectum: Secondary | ICD-10-CM | POA: Diagnosis not present

## 2023-10-18 LAB — CBC WITH DIFFERENTIAL/PLATELET
Abs Immature Granulocytes: 0.04 10*3/uL (ref 0.00–0.07)
Basophils Absolute: 0 10*3/uL (ref 0.0–0.1)
Basophils Relative: 0 %
Eosinophils Absolute: 0.2 10*3/uL (ref 0.0–0.5)
Eosinophils Relative: 2 %
HCT: 27 % — ABNORMAL LOW (ref 39.0–52.0)
Hemoglobin: 9.1 g/dL — ABNORMAL LOW (ref 13.0–17.0)
Immature Granulocytes: 1 %
Lymphocytes Relative: 19 %
Lymphs Abs: 1.7 10*3/uL (ref 0.7–4.0)
MCH: 31.6 pg (ref 26.0–34.0)
MCHC: 33.7 g/dL (ref 30.0–36.0)
MCV: 93.8 fL (ref 80.0–100.0)
Monocytes Absolute: 0.8 10*3/uL (ref 0.1–1.0)
Monocytes Relative: 9 %
Neutro Abs: 6.1 10*3/uL (ref 1.7–7.7)
Neutrophils Relative %: 69 %
Platelets: 169 10*3/uL (ref 150–400)
RBC: 2.88 MIL/uL — ABNORMAL LOW (ref 4.22–5.81)
RDW: 16 % — ABNORMAL HIGH (ref 11.5–15.5)
WBC: 8.8 10*3/uL (ref 4.0–10.5)
nRBC: 0 % (ref 0.0–0.2)

## 2023-10-18 LAB — COMPREHENSIVE METABOLIC PANEL
ALT: 10 U/L (ref 0–44)
AST: 14 U/L — ABNORMAL LOW (ref 15–41)
Albumin: 2.7 g/dL — ABNORMAL LOW (ref 3.5–5.0)
Alkaline Phosphatase: 39 U/L (ref 38–126)
Anion gap: 6 (ref 5–15)
BUN: 34 mg/dL — ABNORMAL HIGH (ref 8–23)
CO2: 20 mmol/L — ABNORMAL LOW (ref 22–32)
Calcium: 7.9 mg/dL — ABNORMAL LOW (ref 8.9–10.3)
Chloride: 112 mmol/L — ABNORMAL HIGH (ref 98–111)
Creatinine, Ser: 0.85 mg/dL (ref 0.61–1.24)
GFR, Estimated: >60 mL/min (ref >60)
Glucose, Bld: 90 mg/dL (ref 70–99)
Potassium: 4 mmol/L (ref 3.5–5.1)
Sodium: 138 mmol/L (ref 135–145)
Total Bilirubin: 1.1 mg/dL (ref 0.3–1.2)
Total Protein: 4.6 g/dL — ABNORMAL LOW (ref 6.5–8.1)

## 2023-10-18 LAB — HEMOGLOBIN AND HEMATOCRIT, BLOOD
HCT: 26.6 % — ABNORMAL LOW (ref 39.0–52.0)
HCT: 26.3 % — ABNORMAL LOW (ref 39.0–52.0)
HCT: 26.1 % — ABNORMAL LOW (ref 39.0–52.0)
Hemoglobin: 9.2 g/dL — ABNORMAL LOW (ref 13.0–17.0)
Hemoglobin: 8.9 g/dL — ABNORMAL LOW (ref 13.0–17.0)
Hemoglobin: 8.8 g/dL — ABNORMAL LOW (ref 13.0–17.0)

## 2023-10-18 LAB — PHOSPHORUS: Phosphorus: 3.7 mg/dL (ref 2.5–4.6)

## 2023-10-18 LAB — MAGNESIUM: Magnesium: 2.1 mg/dL (ref 1.7–2.4)

## 2023-10-18 MED ORDER — ACYCLOVIR 400 MG PO TABS
400.0000 mg | ORAL_TABLET | Freq: Three times a day (TID) | ORAL | Status: DC
  Administered 2023-10-18 – 2023-10-19 (×6): 400 mg via ORAL
  Filled 2023-10-18 (×8): qty 1

## 2023-10-18 NOTE — Progress Notes (Signed)
Patient had a total of 4 bloody stool. 3 at the beginning of night shift and then 1 this morning. Had a total of 2 units of PRBC yesterday.

## 2023-10-18 NOTE — [place] FL2 (Signed)
[place] MEDICAID FL2 LEVEL OF CARE FORM     IDENTIFICATION  Patient Name: Aaron Harmon Birthdate: 17-Feb-1933 Sex: male Admission Date (Current Location): 10/15/2023  [facility] and [place] Number:  Producer, television/film/video and Address:  [facility],  [address] [place], [place] [address]      Provider Number: [id number]  Attending Physician Name and Address:  Merlene Laughter, DO  Relative Name and Phone Number:  Durwin Glaze   [phone]  [place] Daughter   [phone]    Current Level of Care: Hospital Recommended Level of Care: Skilled Nursing Facility Prior Approval Number:    Date Approved/Denied:   PASRR Number: [id number]  Discharge Plan: SNF    Current Diagnoses: Patient Active Problem List   Diagnosis Date Noted   Protein-calorie malnutrition, severe 10/17/2023   BRBPR (bright red blood per rectum) 10/15/2023   Murmur, cardiac 05/11/2022   Mitral valve prolapse 05/11/2022   First degree AV block 05/11/2022   RBBB 05/11/2022   PVC (premature ventricular contraction) 05/11/2022   Pancytopenia (HCC) 01/21/2022   GI bleed 01/21/2022   Acute GI bleeding 01/20/2022   Slow transit constipation 10/27/2021    Orientation RESPIRATION BLADDER Height & Weight     Self, Time, Place, Situation  Normal Continent Weight: 140 lb (63.5 kg) Height:  6\' 1"  (185.4 cm)  BEHAVIORAL SYMPTOMS/MOOD NEUROLOGICAL BOWEL NUTRITION STATUS      Incontinent Diet  AMBULATORY STATUS COMMUNICATION OF NEEDS Skin   Limited Assist Verbally Normal                       Personal Care Assistance Level of Assistance  Bathing, Feeding, Dressing Bathing Assistance: Limited assistance Feeding assistance: Independent Dressing Assistance: Limited assistance     Functional Limitations Info  Sight, Hearing, Speech Sight Info: Adequate Hearing Info: Adequate Speech Info: Adequate    SPECIAL CARE FACTORS FREQUENCY  PT (By licensed PT), OT (By licensed  OT)     PT Frequency: Minimum 5x a week OT Frequency: Minimum 5x a week            Contractures Contractures Info: Not present    Additional Factors Info  Code Status, Allergies Code Status Info: DNR Allergies Info: Ciprofloxacin           Current Medications (10/18/2023):  This is the current hospital active medication list Current Facility-Administered Medications  Medication Dose Route Frequency Provider Last Rate Last Admin   acetaminophen (TYLENOL) tablet 650 mg  650 mg Oral Q6H PRN Kirby Crigler, Mir M, MD       Or   acetaminophen (TYLENOL) suppository 650 mg  650 mg Rectal Q6H PRN Kirby Crigler, Mir M, MD       acyclovir (ZOVIRAX) tablet 400 mg  400 mg Oral TID Marland Mcalpine, Omair Latif, DO   400 mg at 10/18/23 1633   albuterol (PROVENTIL) (2.5 MG/3ML) 0.083% nebulizer solution 2.5 mg  2.5 mg Nebulization Q2H PRN Kirby Crigler, Mir M, MD       feeding supplement (BOOST / RESOURCE BREEZE) liquid 1 Container  1 Container Oral TID BM Sheikh, Omair [place], DO   1 Container at 10/18/23 1413   ondansetron (ZOFRAN) tablet 4 mg  4 mg Oral Q6H PRN Kirby Crigler, Mir M, MD       Or   ondansetron [facility]) injection 4 mg  4 mg Intravenous Q6H PRN Kirby Crigler, Mir M, MD       Oral care mouth rinse  15 mL Mouth Rinse PRN  Kirby Crigler, Mir M, MD       pantoprazole (PROTONIX) injection 40 mg  40 mg Intravenous [address] Olivia Lopez de Gutierrez, DO   40 mg at 10/18/23 4098   traZODone (DESYREL) tablet 25 mg  25 mg Oral QHS PRN Maryln Gottron, MD         Discharge Medications: Please see discharge summary for a list of discharge medications.  Relevant Imaging Results:  Relevant Lab Results:   Additional Information SSN [id number]  Darleene Cleaver, LCSW

## 2023-10-18 NOTE — Plan of Care (Signed)
  Problem: Clinical Measurements: Goal: Ability to maintain clinical measurements within normal limits will improve Outcome: Progressing Goal: Will remain free from infection Outcome: Progressing Goal: Diagnostic test results will improve Outcome: Progressing Goal: Respiratory complications will improve Outcome: Progressing Goal: Cardiovascular complication will be avoided Outcome: Progressing   Problem: Education: Goal: Knowledge of General Education information will improve Description: Including pain rating scale, medication(s)/side effects and non-pharmacologic comfort measures Outcome: Progressing   Problem: Activity: Goal: Risk for activity intolerance will decrease Outcome: Progressing   Problem: Nutrition: Goal: Adequate nutrition will be maintained Outcome: Progressing   Problem: Elimination: Goal: Will not experience complications related to bowel motility Outcome: Progressing Goal: Will not experience complications related to urinary retention Outcome: Progressing

## 2023-10-18 NOTE — Progress Notes (Signed)
Patient had a total of 6 bloody stool during the 7a-7p shift.Night RN aware.

## 2023-10-18 NOTE — TOC Initial Note (Signed)
Transition of Care [facility]) - Initial/Assessment Note    Patient Details  Name: Aaron Harmon MRN: [id number] Date of Birth: 06-22-1933  Transition of [facility] CM/SW Contact:    Darleene Cleaver, LCSW Phone Number: 10/18/2023, 6:26 PM  Clinical Narrative:                  Patient is an 89 year old male who is alert and oriented x4, however was sleeping when CSW attempted to see patient.  Patient lives with his wife at [facility]  Assesment completed by reviewing patient's chart.  Patient does have a PCP, and does not have any SDOH needs.  Patient worked with PT, and they are recommending SNF placement prior to patient discharging back home.  [facility] does have their own rehab facility.  Patient would go to [facility] if he is agreeable and needs it.  TOC to continue to facilitate discharge planning.  Expected Discharge Plan: Skilled Nursing Facility Barriers to Discharge: Continued Medical Work up   Patient Goals and CMS Choice Patient states their goals for this hospitalization and ongoing recovery are:: To go to [facility] rehab, then return to indep living at [email or web address] Compare Post Acute Care list provided to:: Other (Comment Required) (Patient confusied, and wife did not answer phone, chart review.)        Expected Discharge Plan and Services     Post Acute Care Choice: Skilled Nursing Facility Living arrangements for the past 2 months: Independent Living Facility                                      Prior Living Arrangements/Services Living arrangements for the past 2 months: Independent Living Facility Lives with:: Facility Resident, Spouse Patient language and need for interpreter reviewed:: Yes Do you feel safe going back to the place where you live?: No   Per PT recommendations, patient will need SNF placement at [facility] rehab side prior to returning back home.  Need for Family Participation in Patient Care: Yes  (Comment) Care giver support system in place?: No (comment)   Criminal Activity/Legal Involvement Pertinent to Current Situation/Hospitalization: No - Comment as needed  Activities of Daily Living   ADL Screening (condition at time of admission) Independently performs ADLs?: Yes (appropriate for developmental age) Is the patient deaf or have difficulty hearing?: No Does the patient have difficulty seeing, even when wearing glasses/contacts?: No Does the patient have difficulty concentrating, remembering, or making decisions?: No  Permission Sought/Granted Permission sought to share information with : Facility Medical sales representative, Case Manager, Family Supports Permission granted to share information with : Yes, Release of Information Signed, Yes, Verbal Permission Granted  Share Information with NAME: [place] Spouse   [phone]  [place] Daughter   [phone]  Permission granted to share info w AGENCY: [facility].        Emotional Assessment Appearance:: Appears stated age   Affect (typically observed): Calm, Accepting Orientation: : Oriented to Self, Oriented to Place, Oriented to  Time, Oriented to Situation Alcohol / Substance Use: Not Applicable Psych Involvement: No (comment)  Admission diagnosis:  Rectal bleeding [K62.5] BRBPR (bright red blood per rectum) [K62.5] Anemia, unspecified type [D64.9] Patient Active Problem List   Diagnosis Date Noted   Protein-calorie malnutrition, severe 10/17/2023   BRBPR (bright red blood per rectum) 10/15/2023   Murmur, cardiac 05/11/2022   Mitral valve prolapse 05/11/2022  First degree AV block 05/11/2022   RBBB 05/11/2022   PVC (premature ventricular contraction) 05/11/2022   Pancytopenia (HCC) 01/21/2022   GI bleed 01/21/2022   Acute GI bleeding 01/20/2022   Slow transit constipation 10/27/2021   PCP:  [name], MD Pharmacy:   [facility] [phone] Ginette Otto,  - [address] [place] [address] Phone: [phone] Fax: [phone]     Social Determinants of Health (SDOH) Social History: SDOH Screenings   Food Insecurity: No Food Insecurity (10/15/2023)  Housing: Low Risk  (10/15/2023)  Transportation Needs: No Transportation Needs (10/15/2023)  Utilities: Not At Risk (10/15/2023)  Depression (PHQ2-9): Low Risk  (09/28/2023)  Tobacco Use: Low Risk  (10/15/2023)   SDOH Interventions:     Readmission Risk Interventions     No data to display

## 2023-10-18 NOTE — Plan of Care (Signed)
  Problem: Clinical Measurements: Goal: Will remain free from infection Outcome: Progressing Goal: Cardiovascular complication will be avoided Outcome: Progressing   Problem: Coping: Goal: Level of anxiety will decrease Outcome: Progressing   Problem: Elimination: Goal: Will not experience complications related to urinary retention Outcome: Progressing   Problem: Safety: Goal: Ability to remain free from injury will improve Outcome: Progressing   Problem: Skin Integrity: Goal: Risk for impaired skin integrity will decrease Outcome: Progressing

## 2023-10-18 NOTE — Progress Notes (Signed)
PROGRESS NOTE    Aaron Harmon  WUJ:[id number] DOB: March 07, 1933 DOA: 10/15/2023 PCP: [name], MD   Brief Narrative:  HPI per Dr. 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  He has a known history of diverticulosis, recently he has been constipated and says that he manually disimpacted himself a couple of days ago, after which she has had bright red blood per rectum intermittently for the last 3 days.  He had some slight left lower quadrant abdominal pain a couple of days ago, but this resolved with bowel movement.  He takes no blood thinners, today started to come to the ER for evaluation because he got orthostatic this afternoon when he stood up.  Due to the abdominal pain, outpatient CT scan was performed yesterday, but this has not been interpreted.   ED Course: During the emergency department, he has been afebrile blood pressure 127/69.  Saturating 100% on room air.  Lab work was reviewed, significant for normal INR, and hemoglobin down to 12 from 14.2 on 10/10.  **Interim History  GI evaluated and recommending conservative management and recommending continuing to Monitor H/H and have placed patient on CLD.  Patient continues to bleed and was having active bloody bowel movements so GI ordered a stat CTA of the abdomen pelvis to try and localize bleed.  Interventional radiology evaluated and imaging was reviewed and no extravasation was identified so there is no IR planned procedure for further evaluation.  Will type and screen and transfuse 2 more units and obtain palliative care consultation for further goals of care discussion.  Blood count appears to have stabilized but he continues to have some maroon stools with 1 this morning and 3 yesterday.  Per GI may consider repeating a CTA followed by embolization if the  CT is positive but if negative can consider a colonoscopy for and do a colonoscopy to try and pinpoint bleeding.  Oxygen monitor and continue supportive care for now.  Assessment and Plan:  Bright Red Blood Per Rectum and Acute Blood Loss Anemia -This is likely diverticular bleeding, but differential also includes AVMs, internal/external hemorrhoids, etc. -Obs Admission changed to Inpatient Progressive  -Hgb/Hct Trend: Recent Labs  Lab 10/16/23 1856 10/17/23 0507 10/17/23 1017 10/17/23 1459 10/17/23 2325 10/18/23 0438 10/18/23 0922  HGB 8.1* 7.7* 7.5* 8.5* 9.5* 9.1* 9.2*  HCT 25.5* 24.4* 23.9* 25.9* 28.5* 27.0* 26.6*  MCV  --  100.4*  --   --   --  93.8  --   -Will not check Anemia Panel now that he has received blood transfusions -Continue to Monitor on progressive unit with telemetry -Avoid blood thinners -Recently had a CT Chest/Abdomen/Pelvis but will get a stat CTA Bleeding Scan -CTA done and showed "Asymmetric focal enhancement in the proximal descending colon. Although there is no pooling blood, this may reflect focal edema and intermittent hemorrhage. No other source of hemorrhage is identified. Coronary artery disease. 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 Stable bilateral renal cysts. Stable hepatic cysts.. Stable simple cyst in the superior aspect of the spleen. Aortic Atherosclerosis." -Clear liquid diet for now and diet advancement per GI  -Trend hemoglobin every 8 hours -GI consulted and recommending conservative management  -Will type and screen and transfuse 2 units given his active bleeding -Will need to continue to Monitor for Active  Bleeding; GI recommends following H&H's closely if the bleeding worsens or persist and recommending considering a repeat CTA followed by embolization of IR if the bleeding scan is positive.  If repeat CT is done and negative the negative consider a colon prep for colonoscopy to try and pinpoint the bleeding  diverticulum and they do not feel there is colon cancer with a colonoscopy being done in January 2023. -GI recommends continuing supportive care   Acute Blood Loss Anemia -As above   Weight Loss -Patient's wife notes he has lost about 20 pounds this year, he has very low appetite. -CT Chest/Abd/Pelvis obtained outpatient and showed "No acute abnormality in the chest, abdomen, or pelvis. No pleural effusion or explanation for dyspnea. Staghorn calculus in the left renal pelvis measuring 2.8 x 2 cm. There is mild chronic fullness of the calices but no frank hydronephrosis or renal inflammation. Colonic diverticulosis without diverticulitis. Moderate colonic stool burden with scattered areas of fecalization of small bowel contents, suggesting slow transit. No liquid stool or colonic inflammation. Aortic Atherosclerosis" -RD consultation -PT/OT evaluated and recommending SNF  Generalized Weakness -PT/OT recommending SNF  Hx of Prostate Cancer -Follow up with Oncology/Urology as an outpatient  -PCP recently checking PSA  Constipation -Having a lot of bloody bowel movements -GI Following and he had a recent digital fecal disimpaction prior to Admit  Mitral Valve Prolapse -Noted  Metabolic Acidosis -Mild. CO2 is now 20, AG is 6, Chloride Level is 112 still -Continue to Monitor and Trend and repeat CMP in the AM   Herpetic Lesion -Add Acyclovir as patient gets flares from time to time  Severe Malnutrition in the Context of Chronic illness -Nutrition Status: Nutrition Problem: Severe Malnutrition Etiology: chronic illness Signs/Symptoms: meal completion < 25%, estimated needs, percent weight loss Percent weight loss: 9.8 % (x 18 days) Interventions: Boost Breeze  Hypoalbuminemia -Patient's Albumin Trend: Recent Labs  Lab 10/15/23 1635 10/16/23 1009 10/17/23 0507 10/18/23 0438  ALBUMIN 3.6 3.1* 3.0* 2.7*  -Continue to Monitor and Trend and repeat CMP in the AM   DVT  prophylaxis: SCDs Start: 10/15/23 1823    Code Status: Limited: Do not attempt resuscitation (DNR) -DNR-LIMITED -Do Not Intubate/DNI  Family Communication: Discussed with the wife at bedside  Disposition Plan:  Level of care: Progressive Status is: Inpatient Remains inpatient appropriate because: Pending further GI clearance and evaluation   Consultants:  Gastroenterology Interventional Radiology Palliative Care Medicine  Procedures:  As delineated as above  Antimicrobials:  Anti-infectives (From admission, onward)    Start     Dose/Rate Route Frequency Ordered Stop   10/18/23 1100  acyclovir (ZOVIRAX) tablet 400 mg        400 mg Oral 3 times daily 10/18/23 1028         Subjective: Seen and examined at bedside and he continues to feel weak.  I spoke to him about his hemoglobin being fairly stable now.  Last 1 being hyponatremia and is happy about that.  I do more 1 episode this morning had to yesterday.  Denies any abdominal pain.  No other concerns or complaints this time.  Objective: Vitals:   10/17/23 1815 10/17/23 2056 10/18/23 0513 10/18/23 1236  BP: 131/70 (!) 114/43 131/61 116/63  Pulse: 73 76 66 71  Resp: 15 20 20 18   Temp: 98.4 F (36.9 C) 97.8 F (36.6 C) 97.9 F (36.6 C) 98.3 F (36.8 C)  TempSrc: Oral Oral Oral Oral  SpO2: 98% 100% 98% 98%  Weight:  Height:        Intake/Output Summary (Last 24 hours) at 10/18/2023 1418 Last data filed at 10/18/2023 1215 Gross per 24 hour  Intake 315 ml  Output 1040 ml  Net -725 ml   Filed Weights   10/15/23 1613  Weight: 63.5 kg   Examination: Physical Exam:  Constitutional: Thin elderly Caucasian male who appears weak and fatigued Respiratory: Diminished to auscultation bilaterally, no wheezing, rales, rhonchi or crackles. Normal respiratory effort and patient is not tachypenic. No accessory muscle use.  Cardiovascular: RRR, no murmurs / rubs / gallops. S1 and S2 auscultated. No extremity  edema. Abdomen: Soft, non-tender, non-distended. Bowel sounds positive.  GU: Has a herpetic lesion in his groin Musculoskeletal: No clubbing / cyanosis of digits/nails. No joint deformity upper and lower extremities. Skin: No rashes, lesions, ulcers on limited skin evaluation. No induration; Warm and dry.  Neurologic: CN 2-12 grossly intact with no focal deficits. Romberg sign and cerebellar reflexes not assessed.  Psychiatric: Normal judgment and insight. Alert and oriented x 3. Normal mood and appropriate affect.   Data Reviewed: I have personally reviewed following labs and imaging studies  CBC: Recent Labs  Lab 10/15/23 1635 10/15/23 1848 10/16/23 1009 10/16/23 1856 10/17/23 0507 10/17/23 1017 10/17/23 1459 10/17/23 2325 10/18/23 0438 10/18/23 0922  WBC 9.3  --  7.2  --  7.6  --   --   --  8.8  --   NEUTROABS  --   --  5.0  --  4.6  --   --   --  6.1  --   HGB 11.4*   < > 9.3*   < > 7.7* 7.5* 8.5* 9.5* 9.1* 9.2*  HCT 35.3*   < > 29.4*   < > 24.4* 23.9* 25.9* 28.5* 27.0* 26.6*  MCV 97.8  --  98.7  --  100.4*  --   --   --  93.8  --   PLT 224  --  214  --  188  --   --   --  169  --    < > = values in this interval not displayed.   Basic Metabolic Panel: Recent Labs  Lab 10/14/23 1911 10/15/23 1635 10/16/23 1009 10/17/23 0507 10/18/23 0438  NA  --  140 139 138 138  K  --  3.9 3.8 3.9 4.0  CL  --  107 109 112* 112*  CO2  --  24 23 20* 20*  GLUCOSE  --  136* 106* 105* 90  BUN  --  26* 27* 28* 34*  CREATININE 1.00 0.93 0.78 0.94 0.85  CALCIUM  --  8.7* 8.4* 8.2* 7.9*  MG  --   --  2.1 2.1 2.1  PHOS  --   --  3.5 3.6 3.7   GFR: Estimated Creatinine Clearance: 54 mL/min (by C-G formula based on SCr of 0.85 mg/dL). Liver Function Tests: Recent Labs  Lab 10/15/23 1635 10/16/23 1009 10/17/23 0507 10/18/23 0438  AST 18 15 14* 14*  ALT 12 11 9 10   ALKPHOS 53 42 41 39  BILITOT 0.7 0.7 0.6 1.1  PROT 6.4* 5.4* 5.1* 4.6*  ALBUMIN 3.6 3.1* 3.0* 2.7*   No results  for input(s): "LIPASE", "AMYLASE" in the last 168 hours. No results for input(s): "AMMONIA" in the last 168 hours. Coagulation Profile: Recent Labs  Lab 10/15/23 1630  INR 1.0   Cardiac Enzymes: No results for input(s): "CKTOTAL", "CKMB", "CKMBINDEX", "TROPONINI" in the last 168 hours. BNP (last  3 results) No results for input(s): "PROBNP" in the last 8760 hours. HbA1C: No results for input(s): "HGBA1C" in the last 72 hours. CBG: No results for input(s): "GLUCAP" in the last 168 hours. Lipid Profile: No results for input(s): "CHOL", "HDL", "LDLCALC", "TRIG", "CHOLHDL", "LDLDIRECT" in the last 72 hours. Thyroid Function Tests: No results for input(s): "TSH", "T4TOTAL", "FREET4", "T3FREE", "THYROIDAB" in the last 72 hours. Anemia Panel: No results for input(s): "VITAMINB12", "FOLATE", "FERRITIN", "TIBC", "IRON", "RETICCTPCT" in the last 72 hours. Sepsis Labs: No results for input(s): "PROCALCITON", "LATICACIDVEN" in the last 168 hours.  No results found for this or any previous visit (from the past 240 hour(s)).   Radiology Studies: CT ANGIO GI BLEED  Result Date: 10/17/2023 CLINICAL DATA:  Lower GI bleed. EXAM: CTA ABDOMEN AND PELVIS WITHOUT AND WITH CONTRAST TECHNIQUE: Multidetector CT imaging of the abdomen and pelvis was performed using the standard protocol during bolus administration of intravenous contrast. Multiplanar reconstructed images and MIPs were obtained and reviewed to evaluate the vascular anatomy. RADIATION DOSE REDUCTION: This exam was performed according to the departmental dose-optimization program which includes automated exposure control, adjustment of the mA and/or kV according to patient size and/or use of iterative reconstruction technique. CONTRAST:  OMNIPAQUE IOHEXOL 350 MG/ML SOLN COMPARISON:  CT of the abdomen and pelvis 10/14/2023 FINDINGS: VASCULAR Aorta: Diffuse atherosclerotic irregularity is present throughout the abdominal aorta. No aneurysm or  stenosis is present. Celiac: Mild narrowing of the proximal celiac artery measures 3 mm, approximately 50% of the caliber of the more distal vessel. The branch vessels are within normal limits. SMA: SMA origin is within normal limits. Atherosclerotic disease is present with 50% narrowing more distally secondary to noncalcified plaque. Renals: Mild calcifications are present proximally without significant stenosis in the renal arteries. IMA: A moderate proximal stenosis is present. The distal branch vessels are within normal limits. Inflow: Atherosclerotic changes are present without focal stenosis. Proximal Outflow: Atherosclerotic changes are present. No focal stenosis or aneurysm is present. Veins: Within normal limits. The arterial and delayed portal venous phase images demonstrate asymmetric focal enhancement in the proximal descending colon. No pooling contrast is evident. Review of the MIP images confirms the above findings. NON-VASCULAR Lower chest: The lung bases are clear. Coronary artery calcifications are present. Hepatobiliary: Cystic lesions are present in the liver bilaterally, measuring 11 mm in the left lobe and 7 mm in the right lobe. The gallbladder and common bile duct are within normal limits. Pancreas: Unremarkable. No pancreatic ductal dilatation or surrounding inflammatory changes. Spleen: A 2.7 cm simple cyst is present in the superior aspect of the spleen. Adrenals/Urinary Tract: The adrenal glands are normal bilaterally. Bilateral renal cysts are again noted. A staghorn calculus of the left renal collecting system is stable. The distal ureters are within normal limits bilaterally. The urinary bladder is normal. Stomach/Bowel: Stomach and duodenum are within normal limits. The small bowel is unremarkable. Terminal ileum is within normal limits. The appendix is visualized and normal. The ascending and transverse colon are normal. Diverticular changes are present in the distal descending and  sigmoid colon. No inflammatory changes are present to suggest diverticulitis. Lymphatic: No significant adenopathy is present. Reproductive: Prostate is unremarkable. Other: No abdominal wall hernia or abnormality. No abdominopelvic ascites. Musculoskeletal: Grade 1 anterolisthesis at L4-5 is stable. The bony pelvis is normal. The hips are located and within normal limits bilaterally. IMPRESSION: 1. Asymmetric focal enhancement in the proximal descending colon. Although there is no pooling blood, this may reflect focal  edema and intermittent hemorrhage. No other source of hemorrhage is identified. 2. Coronary artery disease. 3. 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 4. Stable bilateral renal cysts. 5. Stable hepatic cysts. 6. Stable simple cyst in the superior aspect of the spleen. 7.  Aortic Atherosclerosis (ICD10-I70.0). Electronically Signed   By: Marin Roberts M.D.   On: 10/17/2023 13:33    Scheduled Meds:  acyclovir  400 mg Oral TID   feeding supplement  1 Container Oral TID BM   pantoprazole (PROTONIX) IV  40 mg Intravenous Q12H   Continuous Infusions:   LOS: 2 days   Marguerita Merles, DO [facility] Available via Epic secure chat 7am-7pm After these hours, please refer to coverage provider listed on [email or web address] 10/18/2023, 2:18 PM

## 2023-10-18 NOTE — Progress Notes (Signed)
[facility] Gastroenterology Progress Note  Aaron Harmon 89 y.o. Jun 30, 1933   Subjective: Maroon-stool this morning X 1 and 3 last night. Denies abdominal pain. Wife in room.  Objective: Vital signs: Vitals:   10/17/23 2056 10/18/23 0513  BP: (!) 114/43 131/61  Pulse: 76 66  Resp: 20 20  Temp: 97.8 F (36.6 C) 97.9 F (36.6 C)  SpO2: 100% 98%    Physical Exam: Gen: lethargic, elderly, thin, no acute distress  HEENT: anicteric sclera CV: RRR Chest: CTA B Abd: soft, nontender, nondistended, +BS Ext: no edema  Lab Results: Recent Labs    10/17/23 0507 10/18/23 0438  NA 138 138  K 3.9 4.0  CL 112* 112*  CO2 20* 20*  GLUCOSE 105* 90  BUN 28* 34*  CREATININE 0.94 0.85  CALCIUM 8.2* 7.9*  MG 2.1 2.1  PHOS 3.6 3.7   Recent Labs    10/17/23 0507 10/18/23 0438  AST 14* 14*  ALT 9 10  ALKPHOS 41 39  BILITOT 0.6 1.1  PROT 5.1* 4.6*  ALBUMIN 3.0* 2.7*   Recent Labs    10/17/23 0507 10/17/23 1017 10/18/23 0438 10/18/23 0922  WBC 7.6  --  8.8  --   NEUTROABS 4.6  --  6.1  --   HGB 7.7*   < > 9.1* 9.2*  HCT 24.4*   < > 27.0* 26.6*  MCV 100.4*  --  93.8  --   PLT 188  --  169  --    < > = values in this interval not displayed.      Assessment/Plan: GI bleed - likely diverticular. Recurrent bleeding. CT angiogram suggests possible bleeding in proximal descending colon. Hgb improved to 9.2 following 2 U PRBCs. Follow H/Hs closely. If bleeding worsens or persists, then may need repeat CTA followed by embolization by IR if positive. If repeat CTA done and negative, then consider colon prep for colonoscopy to try and pinpoint bleeding diverticulum. Doubt colon cancer with colon done in Jan 2023. Clear liquid diet. Supportive care.   [name] 10/18/2023, 12:07 PM  Questions please call [phone] ID: Aaron Harmon, male   DOB: January 18, 1933, 89 y.o.   MRN: [id number]

## 2023-10-18 NOTE — Plan of Care (Signed)

## 2023-10-19 DIAGNOSIS — K625 Hemorrhage of anus and rectum: Secondary | ICD-10-CM | POA: Diagnosis not present

## 2023-10-19 DIAGNOSIS — I341 Nonrheumatic mitral (valve) prolapse: Secondary | ICD-10-CM | POA: Diagnosis not present

## 2023-10-19 DIAGNOSIS — E43 Unspecified severe protein-calorie malnutrition: Secondary | ICD-10-CM | POA: Diagnosis not present

## 2023-10-19 LAB — CBC WITH DIFFERENTIAL/PLATELET
Abs Immature Granulocytes: 0.04 10*3/uL (ref 0.00–0.07)
Basophils Absolute: 0 10*3/uL (ref 0.0–0.1)
Basophils Relative: 1 %
Eosinophils Absolute: 0.1 10*3/uL (ref 0.0–0.5)
Eosinophils Relative: 2 %
HCT: 24.3 % — ABNORMAL LOW (ref 39.0–52.0)
Hemoglobin: 8.3 g/dL — ABNORMAL LOW (ref 13.0–17.0)
Immature Granulocytes: 1 %
Lymphocytes Relative: 24 %
Lymphs Abs: 1.7 10*3/uL (ref 0.7–4.0)
MCH: 31.8 pg (ref 26.0–34.0)
MCHC: 34.2 g/dL (ref 30.0–36.0)
MCV: 93.1 fL (ref 80.0–100.0)
Monocytes Absolute: 0.8 10*3/uL (ref 0.1–1.0)
Monocytes Relative: 12 %
Neutro Abs: 4.4 10*3/uL (ref 1.7–7.7)
Neutrophils Relative %: 60 %
Platelets: 181 10*3/uL (ref 150–400)
RBC: 2.61 MIL/uL — ABNORMAL LOW (ref 4.22–5.81)
RDW: 15.3 % (ref 11.5–15.5)
WBC: 7.1 10*3/uL (ref 4.0–10.5)
nRBC: 0 % (ref 0.0–0.2)

## 2023-10-19 LAB — HEMOGLOBIN AND HEMATOCRIT, BLOOD
HCT: 24.4 % — ABNORMAL LOW (ref 39.0–52.0)
HCT: 23.6 % — ABNORMAL LOW (ref 39.0–52.0)
Hemoglobin: 8.1 g/dL — ABNORMAL LOW (ref 13.0–17.0)
Hemoglobin: 7.9 g/dL — ABNORMAL LOW (ref 13.0–17.0)

## 2023-10-19 LAB — TYPE AND SCREEN
ABO/RH(D): O NEG
Antibody Screen: NEGATIVE
Unit division: 0
Unit division: 0
Unit division: 0

## 2023-10-19 LAB — COMPREHENSIVE METABOLIC PANEL
ALT: 12 U/L (ref 0–44)
AST: 13 U/L — ABNORMAL LOW (ref 15–41)
Albumin: 2.7 g/dL — ABNORMAL LOW (ref 3.5–5.0)
Alkaline Phosphatase: 39 U/L (ref 38–126)
Anion gap: 6 (ref 5–15)
BUN: 34 mg/dL — ABNORMAL HIGH (ref 8–23)
CO2: 21 mmol/L — ABNORMAL LOW (ref 22–32)
Calcium: 8.1 mg/dL — ABNORMAL LOW (ref 8.9–10.3)
Chloride: 112 mmol/L — ABNORMAL HIGH (ref 98–111)
Creatinine, Ser: 1.05 mg/dL (ref 0.61–1.24)
GFR, Estimated: >60 mL/min (ref >60)
Glucose, Bld: 97 mg/dL (ref 70–99)
Potassium: 3.5 mmol/L (ref 3.5–5.1)
Sodium: 139 mmol/L (ref 135–145)
Total Bilirubin: 0.8 mg/dL (ref 0.3–1.2)
Total Protein: 4.8 g/dL — ABNORMAL LOW (ref 6.5–8.1)

## 2023-10-19 LAB — BPAM RBC
Blood Product Expiration Date: 202410222359
Blood Product Expiration Date: 202411052359
Blood Product Expiration Date: 202411142359
ISSUE DATE / TIME: 202410201001
ISSUE DATE / TIME: 202410201754
Unit Type and Rh: 9500
Unit Type and Rh: 9500
Unit Type and Rh: 9500

## 2023-10-19 LAB — PHOSPHORUS: Phosphorus: 4.2 mg/dL (ref 2.5–4.6)

## 2023-10-19 LAB — MAGNESIUM: Magnesium: 1.9 mg/dL (ref 1.7–2.4)

## 2023-10-19 NOTE — Progress Notes (Signed)
[facility] Progress Note  Aaron Harmon 89 y.o. 1933/04/11   Subjective: Small bloody stool this morning and smear of dark red/maroon stool this afternoon. Denies abdominal pain. Wife in room. Nurse in room.  Objective: Vital signs: Vitals:   10/19/23 0534 10/19/23 1503  BP: (!) 144/67 (!) 103/47  Pulse: 72 77  Resp: 15 18  Temp: (!) 97.4 F (36.3 C) 98.1 F (36.7 C)  SpO2: 99% 98%    Physical Exam: Gen: lethargic, elderly, thin, no acute distress  HEENT: anicteric sclera CV: RRR Chest: CTA B Abd: soft, nontender, nondistended, +BS Ext: no edema  Lab Results: Recent Labs    10/18/23 0438 10/19/23 0525  NA 138 139  K 4.0 3.5  CL 112* 112*  CO2 20* 21*  GLUCOSE 90 97  BUN 34* 34*  CREATININE 0.85 1.05  CALCIUM 7.9* 8.1*  MG 2.1 1.9  PHOS 3.7 4.2   Recent Labs    10/18/23 0438 10/19/23 0525  AST 14* 13*  ALT 10 12  ALKPHOS 39 39  BILITOT 1.1 0.8  PROT 4.6* 4.8*  ALBUMIN 2.7* 2.7*   Recent Labs    10/18/23 0438 10/18/23 0922 10/19/23 0525 10/19/23 1405  WBC 8.8  --  7.1  --   NEUTROABS 6.1  --  4.4  --   HGB 9.1*   < > 8.3* 8.1*  HCT 27.0*   < > 24.3* 24.4*  MCV 93.8  --  93.1  --   PLT 169  --  181  --    < > = values in this interval not displayed.      Assessment/Plan: Hematochezia likely diverticular - Clinically it seems the bleeding is resolving. If bleeding increases, then will need another CT angiogram otherwise manage conservatively and avoid colonoscopy unless bleeding persists and if repeat CTA is negative again for active bleeding. Full liquid diet. Supportive care. Will follow up.   [name] 10/19/2023, 4:21 PM  Questions please call [phone] ID: Aaron Harmon, male   DOB: [birth year], 89 y.o.   MRN: [id number]

## 2023-10-19 NOTE — Progress Notes (Signed)
PT Cancellation Note  Patient Details Name: Aaron Harmon MRN: [id number] DOB: May 23, 1933   Cancelled Treatment:    Reason Eval/Treat Not Completed: Other (comment) (asleep). Spoke with pt's spouse, she requests therapist not wake pt. Plan is for pt to go to rehab then home at [facility]. Will continue to follow acutely.   Domenick Bookbinder PT, DPT 10/19/23, 1:09 PM

## 2023-10-19 NOTE — TOC Progression Note (Addendum)
Transition of Care [facility]) - Progression Note    Patient Details  Name: Aaron Harmon MRN: [id number] Date of Birth: January 23, 1933  [facility]) CM/SW Contact  Howell Rucks, RN Phone Number: 10/19/2023, 10:26 AM  Clinical Narrative: PT eval completed, recommendation for short term rehab, pt current LTC at [facility].. NCM outreached to pt's spouse, Gershon Cull, introduced self and role of TOC/NCM, spouse agreeable to short term rehab at [facility]. Updated FL2 completed.  Call to [facility], rep-Donna/admissions coordinator, confirmed bed availabe for short term rehab, expected discharge 10/20/23. TOC will continue to follow.     Expected Discharge Plan: Skilled Nursing Facility Barriers to Discharge: Continued Medical Work up  Expected Discharge Plan and Services     Post Acute Care Choice: Skilled Nursing Facility Living arrangements for the past 2 months: Independent Living Facility                                       Social Determinants of Health (SDOH) Interventions SDOH Screenings   Food Insecurity: No Food Insecurity (10/15/2023)  Housing: Low Risk  (10/15/2023)  Transportation Needs: No Transportation Needs (10/15/2023)  Utilities: Not At Risk (10/15/2023)  Depression (PHQ2-9): Low Risk  (09/28/2023)  Tobacco Use: Low Risk  (10/15/2023)    Readmission Risk Interventions     No data to display

## 2023-10-19 NOTE — Progress Notes (Signed)
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  He has a known history of diverticulosis and has been recently constipated and states that he had manually disimpacted himself a couple days ago after which he noticed bright red blood per rectum intermittently.  He had some left lower quadrant abdominal pain a couple days ago but this resolved after the bowel movement and is not on any blood thinners.  He presented to the ED for further evaluation and was noted to be orthostatic when he stood up.  Due to his recent weight loss he had outpatient CT scan of the chest abdomen pelvis.  Given his bloody stools GI evaluated and recommended conservative management and is placed on a clear liquid diet.  Hemoglobin continued to trend down and he is typed and screened and transfused 2 units PRBCs and he had a stat CTA of the abdomen pelvis to try and localize his bleed however interventional radiology reviewed the imaging and since there is no extravasation they felt that no intervention was warranted at this time.  Blood count stabilized yesterday but continues to drop and he continues to have some maroon stools.  GI may consider repeating a CTA followed by embolization if the CT is positive if it is negative they are can to consider colonoscopy to try and pinpoint the bleeding.  Assessment and Plan:  Bright Red Blood Per Rectum and Acute Blood Loss Anemia -This is likely diverticular bleeding, but differential also includes AVMs, internal/external hemorrhoids, etc. -Hgb/Hct Trend: Recent Labs  Lab 10/16/23 1856 10/17/23 0507 10/17/23 1017 10/17/23 1459 10/17/23 2325  10/18/23 0438 10/18/23 0922  HGB 8.1* 7.7* 7.5* 8.5* 9.5* 9.1* 9.2*  HCT 25.5* 24.4* 23.9* 25.9* 28.5* 27.0* 26.6*  MCV  --  100.4*  --   --   --  93.8  --   -Will not check Anemia Panel now that he has received blood transfusions hematocrit every 8 hours -He is typed and screened and transfused 2 units given his bleeding -Continue to Monitor on progressive unit with telemetry -Avoid blood thinners and NSAIDs -Recently had a CT Chest/Abdomen/Pelvis but will get a stat CTA Bleeding Scan -Stat CTA done and showed "Asymmetric focal enhancement in the proximal descending colon. Although there is no pooling blood, this may reflect focal edema and intermittent hemorrhage. No other source of hemorrhage is identified. Coronary artery disease. 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 Stable bilateral renal cysts. Stable hepatic cysts.. Stable simple cyst in the superior aspect of the spleen. Aortic Atherosclerosis." -Clear liquid diet for now and diet advancement per GI  -GI consulted and recommending conservative management  -Will need to continue to Monitor for Active Bleeding; per GI they recommend H&H's closely if the bleeding worsens or persist and recommending considering a repeat CTA followed by embolization of IR if the bleeding scan is positive.  If repeat CT is done and negative the negative consider a colon prep for colonoscopy to try and pinpoint the bleeding diverticulum and they do not feel t that the patient has colon cancer given his recent colonoscopy done and generally 2023   Acute Blood Loss Anemia -As above  Weight Loss -Patient's wife notes he has lost about 20 pounds this year, he has very low appetite. -CT Chest/Abd/Pelvis obtained outpatient and showed "No acute abnormality in the chest, abdomen, or pelvis. No pleural effusion or explanation for dyspnea. Staghorn calculus in the left renal pelvis measuring 2.8 x 2 cm. There is mild chronic fullness of the  calices but no frank hydronephrosis or renal inflammation. Colonic diverticulosis without diverticulitis. Moderate colonic stool burden with scattered areas of fecalization of small bowel contents, suggesting slow transit. No liquid stool or colonic inflammation. Aortic Atherosclerosis" -RD consultation -PT/OT evaluated and recommending SNF and will need to go once he is stabilized  Generalized Weakness -PT/OT recommending SNF  Hx of Prostate Cancer -Follow up with Oncology/Urology as an outpatient  -PCP recently checking PSA  Constipation -Having a lot of bloody bowel movements after he disimpacted himself -GI Following and appreciate their further evaluation recommendations  Mitral Valve Prolapse -Noted  Metabolic Acidosis -Mild. CO2 is now 21, AG is 6, Chloride Level is 112 still -Continue to Monitor and Trend and repeat CMP in the AM   Herpetic Lesion -Add Acyclovir as patient gets flares from time to time and is now on acyclovir 400 g p.o. 3 times daily  Severe Malnutrition in the Context of Chronic illness -Nutrition Status: Nutrition Problem: Severe Malnutrition Etiology: chronic illness Signs/Symptoms: meal completion < 25%, estimated needs, percent weight loss Percent weight loss: 9.8 % (x 18 days) Interventions: Boost Breeze  Hypoalbuminemia -Patient's Albumin Trend: Recent Labs  Lab 10/15/23 1635 10/16/23 1009 10/17/23 0507 10/18/23 0438  ALBUMIN 3.6 3.1* 3.0* 2.7*  -Continue to Monitor and Trend and repeat CMP in the AM   DVT prophylaxis: SCDs Start: 10/15/23 1823    Code Status: Limited: Do not attempt resuscitation (DNR) -DNR-LIMITED -Do Not Intubate/DNI  Family Communication: No family present at bedside  Disposition Plan:  Level of care: Progressive Status is: Inpatient Remains inpatient appropriate because: Needs further clinical improvement and clearance by GI   Consultants:  GI Interventional Radiology  Procedures:  As delineated as  above  Antimicrobials:  Anti-infectives (From admission, onward)    Start     Dose/Rate Route Frequency Ordered Stop   10/18/23 1100  acyclovir (ZOVIRAX) tablet 400 mg        400 mg Oral 3 times daily 10/18/23 1028         Subjective: Seen and examined at bedside he is fatigued and resting.  Feels sleepy.  Denies any abdominal discomfort.  Continues to have some bloody bowel movements but states that he had none this morning.  Hemoglobin continues to drop.  No family at bedside.  No other concerns or complaints this time.  Objective: Vitals:   10/18/23 0513 10/18/23 1236 10/18/23 1942 10/19/23 0534  BP: 131/61 116/63 (!) 124/54 (!) 144/67  Pulse: 66 71 73 72  Resp: 20 18 15 15   Temp: 97.9 F (36.6 C) 98.3 F (36.8 C) 99.1 F (37.3 C) (!) 97.4 F (36.3 C)  TempSrc: Oral Oral Oral Oral  SpO2: 98% 98% 91% 99%  Weight:      Height:        Intake/Output Summary (Last 24 hours) at 10/19/2023 1340 Last data filed at 10/19/2023 0557 Gross per 24 hour  Intake 720 ml  Output 1150 ml  Net -430 ml   Filed Weights   10/15/23 1613  Weight: 63.5 kg   Examination: Physical Exam:  Constitutional: Thin elderly Caucasian male who is resting but appears extremely  fatigued and weak Respiratory: Diminished to auscultation bilaterally, no wheezing, rales, rhonchi or crackles. Normal respiratory effort and patient is not tachypenic. No accessory muscle use.  Unlabored breathing Cardiovascular: RRR, no murmurs / rubs / gallops. S1 and S2 auscultated. No extremity edema. 2+ pedal pulses. No carotid bruits.  Abdomen: Soft, non-tender, non-distended. Bowel sounds positive.  GU: Deferred. Musculoskeletal: No clubbing / cyanosis of digits/nails. No joint deformity upper and lower extremities. Skin: Has a herpetic lesion noted on his groin area Neurologic: CN 2-12 grossly intact with no focal deficits. Romberg sign and cerebellar reflexes not assessed.  Psychiatric: Normal judgment and insight.  Alert and oriented x 3. Normal mood and appropriate affect.   Data Reviewed: I have personally reviewed following labs and imaging studies  CBC: Recent Labs  Lab 10/15/23 1635 10/15/23 1848 10/16/23 1009 10/16/23 1856 10/17/23 0507 10/17/23 1017 10/18/23 0438 10/18/23 0922 10/18/23 1519 10/18/23 2139 10/19/23 0525  WBC 9.3  --  7.2  --  7.6  --  8.8  --   --   --  7.1  NEUTROABS  --   --  5.0  --  4.6  --  6.1  --   --   --  4.4  HGB 11.4*   < > 9.3*   < > 7.7*   < > 9.1* 9.2* 8.9* 8.8* 8.3*  HCT 35.3*   < > 29.4*   < > 24.4*   < > 27.0* 26.6* 26.3* 26.1* 24.3*  MCV 97.8  --  98.7  --  100.4*  --  93.8  --   --   --  93.1  PLT 224  --  214  --  188  --  169  --   --   --  181   < > = values in this interval not displayed.   Basic Metabolic Panel: Recent Labs  Lab 10/15/23 1635 10/16/23 1009 10/17/23 0507 10/18/23 0438 10/19/23 0525  NA 140 139 138 138 139  K 3.9 3.8 3.9 4.0 3.5  CL 107 109 112* 112* 112*  CO2 24 23 20* 20* 21*  GLUCOSE 136* 106* 105* 90 97  BUN 26* 27* 28* 34* 34*  CREATININE 0.93 0.78 0.94 0.85 1.05  CALCIUM 8.7* 8.4* 8.2* 7.9* 8.1*  MG  --  2.1 2.1 2.1 1.9  PHOS  --  3.5 3.6 3.7 4.2   GFR: Estimated Creatinine Clearance: 43.7 mL/min (by C-G formula based on SCr of 1.05 mg/dL). Liver Function Tests: Recent Labs  Lab 10/15/23 1635 10/16/23 1009 10/17/23 0507 10/18/23 0438 10/19/23 0525  AST 18 15 14* 14* 13*  ALT 12 11 9 10 12   ALKPHOS 53 42 41 39 39  BILITOT 0.7 0.7 0.6 1.1 0.8  PROT 6.4* 5.4* 5.1* 4.6* 4.8*  ALBUMIN 3.6 3.1* 3.0* 2.7* 2.7*   No results for input(s): "LIPASE", "AMYLASE" in the last 168 hours. No results for input(s): "AMMONIA" in the last 168 hours. Coagulation Profile: Recent Labs  Lab 10/15/23 1630  INR 1.0   Cardiac Enzymes: No results for input(s): "CKTOTAL", "CKMB", "CKMBINDEX", "TROPONINI" in the last 168 hours. BNP (last 3 results) No results for input(s): "PROBNP" in the last 8760 hours. HbA1C: No  results for input(s): "HGBA1C" in the last 72 hours. CBG: No results for input(s): "GLUCAP" in the last 168 hours. Lipid Profile: No results for input(s): "CHOL", "HDL", "LDLCALC", "TRIG", "CHOLHDL", "LDLDIRECT" in the last 72 hours. Thyroid Function Tests: No results for input(s): "TSH", "T4TOTAL", "FREET4", "  T3FREE", "THYROIDAB" in the last 72 hours. Anemia Panel: No results for input(s): "VITAMINB12", "FOLATE", "FERRITIN", "TIBC", "IRON", "RETICCTPCT" in the last 72 hours. Sepsis Labs: No results for input(s): "PROCALCITON", "LATICACIDVEN" in the last 168 hours.  No results found for this or any previous visit (from the past 240 hour(s)).   Radiology Studies: No results found.  Scheduled Meds:  acyclovir  400 mg Oral TID   feeding supplement  1 Container Oral TID BM   pantoprazole (PROTONIX) IV  40 mg Intravenous Q12H   Continuous Infusions:   LOS: 3 days   Marguerita Merles, DO [facility] Available via Epic secure chat 7am-7pm After these hours, please refer to coverage provider listed on [email or web address] 10/19/2023, 1:40 PM

## 2023-10-20 DIAGNOSIS — D649 Anemia, unspecified: Secondary | ICD-10-CM | POA: Diagnosis not present

## 2023-10-20 DIAGNOSIS — K625 Hemorrhage of anus and rectum: Secondary | ICD-10-CM | POA: Diagnosis not present

## 2023-10-20 LAB — HEMOGLOBIN AND HEMATOCRIT, BLOOD
HCT: 24.3 % — ABNORMAL LOW (ref 39.0–52.0)
HCT: 24.1 % — ABNORMAL LOW (ref 39.0–52.0)
HCT: 24 % — ABNORMAL LOW (ref 39.0–52.0)
Hemoglobin: 8 g/dL — ABNORMAL LOW (ref 13.0–17.0)
Hemoglobin: 8 g/dL — ABNORMAL LOW (ref 13.0–17.0)
Hemoglobin: 8 g/dL — ABNORMAL LOW (ref 13.0–17.0)

## 2023-10-20 LAB — BASIC METABOLIC PANEL
Anion gap: 8 (ref 5–15)
BUN: 30 mg/dL — ABNORMAL HIGH (ref 8–23)
CO2: 19 mmol/L — ABNORMAL LOW (ref 22–32)
Calcium: 7.8 mg/dL — ABNORMAL LOW (ref 8.9–10.3)
Chloride: 111 mmol/L (ref 98–111)
Creatinine, Ser: 0.93 mg/dL (ref 0.61–1.24)
GFR, Estimated: >60 mL/min (ref >60)
Glucose, Bld: 117 mg/dL — ABNORMAL HIGH (ref 70–99)
Potassium: 3 mmol/L — ABNORMAL LOW (ref 3.5–5.1)
Sodium: 138 mmol/L (ref 135–145)

## 2023-10-20 MED ORDER — POTASSIUM CHLORIDE CRYS ER 20 MEQ PO TBCR
40.0000 meq | EXTENDED_RELEASE_TABLET | Freq: Two times a day (BID) | ORAL | Status: AC
  Administered 2023-10-20 (×2): 40 meq via ORAL
  Filled 2023-10-20 (×2): qty 2

## 2023-10-20 NOTE — Progress Notes (Signed)
[facility] PROGRESS NOTE    Progress Note  Aaron Harmon  EXB:[id number] DOB: November 08, 1933 DOA: 10/15/2023 PCP: [name], MD     Brief Narrative:   Aaron Harmon is an 89 y.o. male past medical history significant mitral valve prolapse, history of prostate cancer, known diverticulosis to the hospital with bright red blood per rectum and disimpacted himself couple of days ago after which she started having bright red blood per rectum intermittently.  In the ED he was noted to be orthostatic due to his recent weight loss CT scan of the abdomen pelvis was done.  GI was consulted recommended conservative management.  As his hemoglobin slowly drifted down he was transfused 2 units of packed red blood cells CT of the abdomen pelvis showed localized bleeding however interventional radiology reviewed the image and send there is no extravasation they felt no intervention was warranted.     Assessment/Plan:   BRBPR (bright red blood per rectum/acute blood loss anemia: There is likely a diverticular bleed. Hemoglobin this morning is 9.2.  He is status post 2 units of packed red blood cells. GI was consulted recommended a clear liquid diet. He also recommended conservative management. GI recommended to follow H&H closely if bleeding worsen recommended a CTA followed by IR embolization.  Dizziness upon standing/lightheadedness upon standing: Check orthostatics. At home he is on no antihypertensive medication. Consult physical therapy out of bed to chair.  Acute blood loss anemia: Noted as above try to keep hemoglobin greater than 8.  Weight loss: Has a very decreased appetite per wife. PT recommended rehab facility.  Generalized weakness: Will need skilled nursing facility.  History of prostate cancer: Follow-up with urology and oncology as an outpatient.  Constipation: He relates he had a lot of bloody bowel movement after disimpacting himself.  Metabolic  acidosis: Resolved.  Herpetic lesions: No on acyclovir.  Severe protein caloric malnutrition/hypoalbuminemia: Noted.   DVT prophylaxis: scd Family Communication:wife Status is: Inpatient Remains inpatient appropriate because: Acute blood loss anemia    Code Status:     Code Status Orders  (From admission, onward)           Start     Ordered   10/17/23 1039  Do not attempt resuscitation (DNR)- Limited -Do Not Intubate (DNI)  (Code Status)  Continuous       Question Answer Comment  If pulseless and not breathing No CPR or chest compressions.   In Pre-Arrest Conditions (Patient Is Breathing and Has A Pulse) Do not intubate. Provide all appropriate non-invasive medical interventions. Avoid ICU transfer unless indicated or required.   Consent: Discussion documented in EHR or advanced directives reviewed      10/17/23 1039           Code Status History     Date Active Date Inactive Code Status Order ID Comments User Context   10/15/2023 1825 10/17/2023 1039 Full Code [id number]  Maryln Gottron, MD ED   01/21/2022 0054 01/24/2022 2008 Full Code [id number]  Eduard Clos, MD ED      Advance Directive Documentation    Flowsheet Row Most Recent Value  Type of Advance Directive Living will  Pre-existing out of facility DNR order (yellow form or pink MOST form) --  "MOST" Form in Place? --         IV Access:   Peripheral IV   Procedures and diagnostic studies:   No results found.   Medical Consultants:   None.   Subjective:  Aaron Harmon he relates that headedness upon standing.  Objective:    Vitals:   10/19/23 1503 10/19/23 2014 10/20/23 0057 10/20/23 0613  BP: (!) 103/47 (!) 118/46 (!) 143/76 (!) 145/72  Pulse: 77 78 82 76  Resp: 18 18 16 14   Temp: 98.1 F (36.7 C) 99.1 F (37.3 C) 97.8 F (36.6 C) 97.7 F (36.5 C)  TempSrc: Oral Oral Oral Oral  SpO2: 98% 97% 99% 100%  Weight:      Height:       SpO2: 100  %   Intake/Output Summary (Last 24 hours) at 10/20/2023 1128 Last data filed at 10/20/2023 0900 Gross per 24 hour  Intake --  Output 450 ml  Net -450 ml   Filed Weights   10/15/23 1613  Weight: 63.5 kg    Exam: General exam: In no acute distress. Respiratory system: Good air movement and clear to auscultation. Cardiovascular system: S1 & S2 heard, RRR. No JVD. Gastrointestinal system: Abdomen is nondistended, soft and nontender.  Extremities: No pedal edema. Skin: No rashes, lesions or ulcers Psychiatry: Judgement and insight appear normal. Mood & affect appropriate.    Data Reviewed:    Labs: Basic Metabolic Panel: Recent Labs  Lab 10/15/23 1635 10/16/23 1009 10/17/23 0507 10/18/23 0438 10/19/23 0525  NA 140 139 138 138 139  K 3.9 3.8 3.9 4.0 3.5  CL 107 109 112* 112* 112*  CO2 24 23 20* 20* 21*  GLUCOSE 136* 106* 105* 90 97  BUN 26* 27* 28* 34* 34*  CREATININE 0.93 0.78 0.94 0.85 1.05  CALCIUM 8.7* 8.4* 8.2* 7.9* 8.1*  MG  --  2.1 2.1 2.1 1.9  PHOS  --  3.5 3.6 3.7 4.2   GFR Estimated Creatinine Clearance: 43.7 mL/min (by C-G formula based on SCr of 1.05 mg/dL). Liver Function Tests: Recent Labs  Lab 10/15/23 1635 10/16/23 1009 10/17/23 0507 10/18/23 0438 10/19/23 0525  AST 18 15 14* 14* 13*  ALT 12 11 9 10 12   ALKPHOS 53 42 41 39 39  BILITOT 0.7 0.7 0.6 1.1 0.8  PROT 6.4* 5.4* 5.1* 4.6* 4.8*  ALBUMIN 3.6 3.1* 3.0* 2.7* 2.7*   No results for input(s): "LIPASE", "AMYLASE" in the last 168 hours. No results for input(s): "AMMONIA" in the last 168 hours. Coagulation profile Recent Labs  Lab 10/15/23 1630  INR 1.0   COVID-19 Labs  No results for input(s): "DDIMER", "FERRITIN", "LDH", "CRP" in the last 72 hours.  Lab Results  Component Value Date   SARSCOV2NAA NEGATIVE 01/20/2022    CBC: Recent Labs  Lab 10/15/23 1635 10/15/23 1848 10/16/23 1009 10/16/23 1856 10/17/23 0507 10/17/23 1017 10/18/23 0438 10/18/23 0922 10/19/23 0525  10/19/23 1405 10/19/23 1939 10/20/23 0117 10/20/23 0733  WBC 9.3  --  7.2  --  7.6  --  8.8  --  7.1  --   --   --   --   NEUTROABS  --   --  5.0  --  4.6  --  6.1  --  4.4  --   --   --   --   HGB 11.4*   < > 9.3*   < > 7.7*   < > 9.1*   < > 8.3* 8.1* 7.9* 8.0* 8.0*  HCT 35.3*   < > 29.4*   < > 24.4*   < > 27.0*   < > 24.3* 24.4* 23.6* 24.3* 24.1*  MCV 97.8  --  98.7  --  100.4*  --  93.8  --  93.1  --   --   --   --   PLT 224  --  214  --  188  --  169  --  181  --   --   --   --    < > = values in this interval not displayed.   Cardiac Enzymes: No results for input(s): "CKTOTAL", "CKMB", "CKMBINDEX", "TROPONINI" in the last 168 hours. BNP (last 3 results) No results for input(s): "PROBNP" in the last 8760 hours. CBG: No results for input(s): "GLUCAP" in the last 168 hours. D-Dimer: No results for input(s): "DDIMER" in the last 72 hours. Hgb A1c: No results for input(s): "HGBA1C" in the last 72 hours. Lipid Profile: No results for input(s): "CHOL", "HDL", "LDLCALC", "TRIG", "CHOLHDL", "LDLDIRECT" in the last 72 hours. Thyroid function studies: No results for input(s): "TSH", "T4TOTAL", "T3FREE", "THYROIDAB" in the last 72 hours.  Invalid input(s): "FREET3" Anemia work up: No results for input(s): "VITAMINB12", "FOLATE", "FERRITIN", "TIBC", "IRON", "RETICCTPCT" in the last 72 hours. Sepsis Labs: Recent Labs  Lab 10/16/23 1009 10/17/23 0507 10/18/23 0438 10/19/23 0525  WBC 7.2 7.6 8.8 7.1   Microbiology No results found for this or any previous visit (from the past 240 hour(s)).   Medications:    acyclovir  400 mg Oral TID   feeding supplement  1 Container Oral TID BM   pantoprazole (PROTONIX) IV  40 mg Intravenous Q12H   Continuous Infusions:    LOS: 4 days   Marinda Elk  [facility]  10/20/2023, 11:28 AM

## 2023-10-20 NOTE — Progress Notes (Signed)
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    PT Comments  Pt needing encouragement and education with therapy. Encouraged pt to mobilize and educated on raising [facility], educated on exercises with questionable carryover. Pt performs supine<>sit with supv, no physical assist. Pt declines to stand, requests to return to supine due to dizziness complaints, reassured BP stable but pt insists. Pt tolerates supine exercises without complaints.   Supine 109/55 HR 77 SpO2 100% Sitting 97/56 HR 77 SpO2 100% Sitting after 3 min 114/63 HR 77 SpO2 100%   If plan is discharge home, recommend the following: A lot of help with walking and/or transfers;A lot of help with bathing/dressing/bathroom;Assistance with cooking/housework;Help with stairs or ramp for entrance   Can travel by private vehicle        Equipment Recommendations  None recommended by PT    Recommendations for Other Services       Precautions / Restrictions Precautions Precautions: Fall Restrictions Weight Bearing Restrictions: No     Mobility  Bed Mobility Overal bed mobility: Needs Assistance Bed Mobility: Supine to Sit, Sit to Supine     Supine to sit: Supervision Sit to supine: Supervision   General bed mobility comments: supv, no physical assist, increased time and effort    Transfers                   General transfer comment: pt declines to stand despite encouragement, reassured BP not decreasing    Ambulation/Gait                   Stairs             Wheelchair Mobility     Tilt Bed    Modified Rankin (Stroke Patients Only)       Balance Overall balance assessment: Needs  assistance Sitting-balance support: Feet supported Sitting balance-Leahy Scale: Good                                      Cognition Arousal: Alert Behavior During Therapy: WFL for tasks assessed/performed Overall Cognitive Status: Within Functional Limits for tasks assessed                                          Exercises General Exercises - Lower Extremity Ankle Circles/Pumps: AROM, Both, 10 reps, Supine Quad Sets: AROM, Strengthening, Both, 10 reps, Supine Hip ABduction/ADduction: AROM, Strengthening, Both, 10 reps, Supine Straight Leg Raises: AROM, Strengthening, Both, 10 reps, Supine Heel Raises: AROM, Strengthening, Both, 10 reps, Seated    General Comments        Pertinent Vitals/Pain Pain Assessment Pain Assessment: No/denies pain    Home Living                          Prior Function            PT Goals (current goals can now be found in the care plan section) Acute Rehab PT Goals PT Goal Formulation: With patient Time For Goal Achievement: 10/30/23 Potential to Achieve Goals: Good Progress  towards PT goals: Progressing toward goals    Frequency    Min 1X/week      PT Plan      Co-evaluation              AM-PAC PT "6 Clicks" Mobility   Outcome Measure  Help needed turning from your back to your side while in a flat bed without using bedrails?: A Little Help needed moving from lying on your back to sitting on the side of a flat bed without using bedrails?: A Little Help needed moving to and from a bed to a chair (including a wheelchair)?: A Lot Help needed standing up from a chair using your arms (e.g., wheelchair or bedside chair)?: A Lot Help needed to walk in hospital room?: A Lot Help needed climbing 3-5 steps with a railing? : A Lot 6 Click Score: 14    End of Session   Activity Tolerance: Patient tolerated treatment well Patient left: in bed;with call bell/phone within reach;with bed  alarm set Nurse Communication: Mobility status;Other (comment) (BP) PT Visit Diagnosis: Difficulty in walking, not elsewhere classified (R26.2);Muscle weakness (generalized) (M62.81)     Time: 9563-8756 PT Time Calculation (min) (ACUTE ONLY): 31 min  Charges:    $Therapeutic Exercise: 8-22 mins $Therapeutic Activity: 8-22 mins PT General Charges $$ ACUTE PT VISIT: 1 Visit                     Tori Allina Riches PT, DPT 10/20/23, 1:17 PM

## 2023-10-20 NOTE — Progress Notes (Signed)
[facility] Gastroenterology Progress Note  Aaron Harmon 89 y.o. 10/05/1933   Subjective: No rectal bleeding or BMs overnight. Denies abdominal pain. Feels lightheaded with sitting up in bed with PT.  Objective: Vital signs: Vitals:   10/20/23 0057 10/20/23 0613  BP: (!) 143/76 (!) 145/72  Pulse: 82 76  Resp: 16 14  Temp: 97.8 F (36.6 C) 97.7 F (36.5 C)  SpO2: 99% 100%    Physical Exam: Gen: lethargic, elderly, thin, no acute distress  HEENT: anicteric sclera CV: RRR Chest: CTA B Abd: soft, nontender, nondistended, +BS Ext: no edema  Lab Results: Recent Labs    10/18/23 0438 10/19/23 0525 10/20/23 1153  NA 138 139 138  K 4.0 3.5 3.0*  CL 112* 112* 111  CO2 20* 21* 19*  GLUCOSE 90 97 117*  BUN 34* 34* 30*  CREATININE 0.85 1.05 0.93  CALCIUM 7.9* 8.1* 7.8*  MG 2.1 1.9  --   PHOS 3.7 4.2  --    Recent Labs    10/18/23 0438 10/19/23 0525  AST 14* 13*  ALT 10 12  ALKPHOS 39 39  BILITOT 1.1 0.8  PROT 4.6* 4.8*  ALBUMIN 2.7* 2.7*   Recent Labs    10/18/23 0438 10/18/23 0922 10/19/23 0525 10/19/23 1405 10/20/23 0117 10/20/23 0733  WBC 8.8  --  7.1  --   --   --   NEUTROABS 6.1  --  4.4  --   --   --   HGB 9.1*   < > 8.3*   < > 8.0* 8.0*  HCT 27.0*   < > 24.3*   < > 24.3* 24.1*  MCV 93.8  --  93.1  --   --   --   PLT 169  --  181  --   --   --    < > = values in this interval not displayed.      Assessment/Plan: GI bleed likely diverticular that seems to have stopped. Hgb stable at 8. No rectal bleeding in over 24 hours. Changed to soft diet. Continue supportive care. Will f/u.   [name] 10/20/2023, 3:01 PM  Questions please call [phone] ID: Aaron Harmon, male   DOB: 1933/03/12, 89 y.o.   MRN: [id number]

## 2023-10-21 DIAGNOSIS — K625 Hemorrhage of anus and rectum: Secondary | ICD-10-CM | POA: Diagnosis not present

## 2023-10-21 DIAGNOSIS — D649 Anemia, unspecified: Secondary | ICD-10-CM | POA: Diagnosis not present

## 2023-10-21 LAB — BASIC METABOLIC PANEL
Anion gap: 6 (ref 5–15)
BUN: 26 mg/dL — ABNORMAL HIGH (ref 8–23)
CO2: 21 mmol/L — ABNORMAL LOW (ref 22–32)
Calcium: 8 mg/dL — ABNORMAL LOW (ref 8.9–10.3)
Chloride: 112 mmol/L — ABNORMAL HIGH (ref 98–111)
Creatinine, Ser: 1.01 mg/dL (ref 0.61–1.24)
GFR, Estimated: >60 mL/min (ref >60)
Glucose, Bld: 95 mg/dL (ref 70–99)
Potassium: 4.4 mmol/L (ref 3.5–5.1)
Sodium: 139 mmol/L (ref 135–145)

## 2023-10-21 LAB — HEMOGLOBIN AND HEMATOCRIT, BLOOD
HCT: 21.9 % — ABNORMAL LOW (ref 39.0–52.0)
HCT: 23.4 % — ABNORMAL LOW (ref 39.0–52.0)
HCT: 28.6 % — ABNORMAL LOW (ref 39.0–52.0)
Hemoglobin: 7.3 g/dL — ABNORMAL LOW (ref 13.0–17.0)
Hemoglobin: 7.4 g/dL — ABNORMAL LOW (ref 13.0–17.0)
Hemoglobin: 9.7 g/dL — ABNORMAL LOW (ref 13.0–17.0)

## 2023-10-21 LAB — PREPARE RBC (CROSSMATCH)

## 2023-10-21 MED ORDER — POLYETHYLENE GLYCOL 3350 17 G PO PACK
17.0000 g | PACK | Freq: Two times a day (BID) | ORAL | Status: DC
  Administered 2023-10-21 – 2023-10-22 (×3): 17 g via ORAL
  Filled 2023-10-21 (×3): qty 1

## 2023-10-21 MED ORDER — SODIUM CHLORIDE 0.9% IV SOLUTION: Freq: Once | INTRAVENOUS | Status: AC

## 2023-10-21 NOTE — Progress Notes (Signed)
[facility] Gastroenterology Progress Note  Aaron Harmon 89 y.o. 03-Apr-1933   Subjective: No bleeding since 10/22. Denies abdominal pain. Tired of getting blood draws.  Objective: Vital signs: Vitals:   10/20/23 2023 10/21/23 0521  BP: (!) 121/49 (!) 124/50  Pulse: 88 71  Resp: 16 18  Temp: 99 F (37.2 C) 98.7 F (37.1 C)  SpO2: 97% 99%    Physical Exam: Gen: lethargic, elderly, thin, no acute distress  HEENT: anicteric sclera CV: RRR Chest: CTA B Abd: soft, nontender, nondistended, +BS Ext: no edema  Lab Results: Recent Labs    10/19/23 0525 10/20/23 1153 10/21/23 0454  NA 139 138 139  K 3.5 3.0* 4.4  CL 112* 111 112*  CO2 21* 19* 21*  GLUCOSE 97 117* 95  BUN 34* 30* 26*  CREATININE 1.05 0.93 1.01  CALCIUM 8.1* 7.8* 8.0*  MG 1.9  --   --   PHOS 4.2  --   --    Recent Labs    10/19/23 0525  AST 13*  ALT 12  ALKPHOS 39  BILITOT 0.8  PROT 4.8*  ALBUMIN 2.7*   Recent Labs    10/19/23 0525 10/19/23 1405 10/20/23 1153 10/21/23 0624  WBC 7.1  --   --   --   NEUTROABS 4.4  --   --   --   HGB 8.3*   < > 8.0* 7.3*  HCT 24.3*   < > 24.0* 21.9*  MCV 93.1  --   --   --   PLT 181  --   --   --    < > = values in this interval not displayed.      Assessment/Plan: Lower GI bleed likely diverticular that seems to have resolved. Hgb 7.3. Agree with blood transfusion. Soft diet. Ok to go home in next 1-2 days. F/U with GI prn. Will sign off. Call if questions.   [name] 10/21/2023, 8:45 AM  Questions please call [phone] ID: Aaron Harmon, male   DOB: March 09, 1933, 89 y.o.   MRN: [id number]

## 2023-10-21 NOTE — Progress Notes (Signed)
[facility] PROGRESS NOTE    Progress Note  Aaron Harmon  UJW:[id number] DOB: March 24, 1933 DOA: 10/15/2023 PCP: [name], MD     Brief Narrative:   Aaron Harmon is an 89 y.o. male past medical history significant mitral valve prolapse, history of prostate cancer, known diverticulosis to the hospital with bright red blood per rectum and disimpacted himself couple of days ago after which she started having bright red blood per rectum intermittently.  In the ED he was noted to be orthostatic due to his recent weight loss CT scan of the abdomen pelvis was done.  GI was consulted recommended conservative management.  As his hemoglobin slowly drifted down he was transfused 2 units of packed red blood cells CT of the abdomen pelvis showed localized bleeding however interventional radiology reviewed the image and send there is no extravasation they felt no intervention was warranted.     Assessment/Plan:   BRBPR (bright red blood per rectum/acute blood loss anemia: There is likely a diverticular bleed. Hemoglobin this morning is 7.3 will transfuse 2 unit of packed red blood cells check hematocrit post transfusional. GI agree with management no further melanotic stools or bleeding they have signed off. He also recommended conservative management. GI recommended to follow H&H closely if bleeding worsen recommended a CTA followed by IR embolization.  Dizziness upon standing/lightheadedness upon standing: Orthostatics have,, is probably deconditioning. At home he is on no antihypertensive medication. Continue to work with physical therapy will go back to skilled nursing facility [facility].  Acute blood loss anemia: Noted as above try to keep hemoglobin greater than 8.  Weight loss: Has a very decreased appetite per wife. PT recommended rehab facility.  Generalized weakness: Will need skilled nursing facility.  He status post PT evaluation.  History of prostate  cancer: Follow-up with urology and oncology as an outpatient.  Constipation: He relates he had a lot of bloody bowel movement after disimpacting himself. Continue MiraLAX p.o. twice daily  Metabolic acidosis: Resolved.  Herpetic lesions: No on acyclovir.  Severe protein caloric malnutrition/hypoalbuminemia: Noted.   DVT prophylaxis: scd Family Communication:wife Status is: Inpatient Remains inpatient appropriate because: Acute blood loss anemia    Code Status:     Code Status Orders  (From admission, onward)           Start     Ordered   10/17/23 1039  Do not attempt resuscitation (DNR)- Limited -Do Not Intubate (DNI)  (Code Status)  Continuous       Question Answer Comment  If pulseless and not breathing No CPR or chest compressions.   In Pre-Arrest Conditions (Patient Is Breathing and Has A Pulse) Do not intubate. Provide all appropriate non-invasive medical interventions. Avoid ICU transfer unless indicated or required.   Consent: Discussion documented in EHR or advanced directives reviewed      10/17/23 1039           Code Status History     Date Active Date Inactive Code Status Order ID Comments User Context   10/15/2023 1825 10/17/2023 1039 Full Code [id number]  Maryln Gottron, MD ED   01/21/2022 0054 01/24/2022 2008 Full Code [id number]  Eduard Clos, MD ED      Advance Directive Documentation    Flowsheet Row Most Recent Value  Type of Advance Directive Living will  Pre-existing out of facility DNR order (yellow form or pink MOST form) --  "MOST" Form in Place? --  IV Access:   Peripheral IV   Procedures and diagnostic studies:   No results found.   Medical Consultants:   None.   Subjective:    Aaron Harmon still dizzy upon standing.  Objective:    Vitals:   10/20/23 0613 10/20/23 1317 10/20/23 2023 10/21/23 0521  BP: (!) 145/72  (!) 121/49 (!) 124/50  Pulse: 76  88 71  Resp: 14  16 18   Temp: 97.7  F (36.5 C)  99 F (37.2 C) 98.7 F (37.1 C)  TempSrc: Oral  Oral Oral  SpO2: 100% 100% 97% 99%  Weight:      Height:       SpO2: 99 %   Intake/Output Summary (Last 24 hours) at 10/21/2023 1035 Last data filed at 10/21/2023 1000 Gross per 24 hour  Intake 360 ml  Output 1325 ml  Net -965 ml   Filed Weights   10/15/23 1613  Weight: 63.5 kg    Exam: General exam: In no acute distress. Respiratory system: Good air movement and clear to auscultation. Cardiovascular system: S1 & S2 heard, RRR. No JVD. Gastrointestinal system: Abdomen is nondistended, soft and nontender.  Extremities: No pedal edema. Skin: No rashes, lesions or ulcers Psychiatry: Judgement and insight appear normal. Mood & affect appropriate.  Data Reviewed:    Labs: Basic Metabolic Panel: Recent Labs  Lab 10/16/23 1009 10/17/23 0507 10/18/23 0438 10/19/23 0525 10/20/23 1153 10/21/23 0454  NA 139 138 138 139 138 139  K 3.8 3.9 4.0 3.5 3.0* 4.4  CL 109 112* 112* 112* 111 112*  CO2 23 20* 20* 21* 19* 21*  GLUCOSE 106* 105* 90 97 117* 95  BUN 27* 28* 34* 34* 30* 26*  CREATININE 0.78 0.94 0.85 1.05 0.93 1.01  CALCIUM 8.4* 8.2* 7.9* 8.1* 7.8* 8.0*  MG 2.1 2.1 2.1 1.9  --   --   PHOS 3.5 3.6 3.7 4.2  --   --    GFR Estimated Creatinine Clearance: 45.4 mL/min (by C-G formula based on SCr of 1.01 mg/dL). Liver Function Tests: Recent Labs  Lab 10/15/23 1635 10/16/23 1009 10/17/23 0507 10/18/23 0438 10/19/23 0525  AST 18 15 14* 14* 13*  ALT 12 11 9 10 12   ALKPHOS 53 42 41 39 39  BILITOT 0.7 0.7 0.6 1.1 0.8  PROT 6.4* 5.4* 5.1* 4.6* 4.8*  ALBUMIN 3.6 3.1* 3.0* 2.7* 2.7*   No results for input(s): "LIPASE", "AMYLASE" in the last 168 hours. No results for input(s): "AMMONIA" in the last 168 hours. Coagulation profile Recent Labs  Lab 10/15/23 1630  INR 1.0   COVID-19 Labs  No results for input(s): "DDIMER", "FERRITIN", "LDH", "CRP" in the last 72 hours.  Lab Results  Component Value  Date   SARSCOV2NAA NEGATIVE 01/20/2022    CBC: Recent Labs  Lab 10/15/23 1635 10/15/23 1848 10/16/23 1009 10/16/23 1856 10/17/23 0507 10/17/23 1017 10/18/23 0438 10/18/23 0922 10/19/23 0525 10/19/23 1405 10/20/23 0117 10/20/23 0733 10/20/23 1153 10/21/23 0624 10/21/23 0914  WBC 9.3  --  7.2  --  7.6  --  8.8  --  7.1  --   --   --   --   --   --   NEUTROABS  --   --  5.0  --  4.6  --  6.1  --  4.4  --   --   --   --   --   --   HGB 11.4*   < > 9.3*   < >  7.7*   < > 9.1*   < > 8.3*   < > 8.0* 8.0* 8.0* 7.3* 7.4*  HCT 35.3*   < > 29.4*   < > 24.4*   < > 27.0*   < > 24.3*   < > 24.3* 24.1* 24.0* 21.9* 23.4*  MCV 97.8  --  98.7  --  100.4*  --  93.8  --  93.1  --   --   --   --   --   --   PLT 224  --  214  --  188  --  169  --  181  --   --   --   --   --   --    < > = values in this interval not displayed.   Cardiac Enzymes: No results for input(s): "CKTOTAL", "CKMB", "CKMBINDEX", "TROPONINI" in the last 168 hours. BNP (last 3 results) No results for input(s): "PROBNP" in the last 8760 hours. CBG: No results for input(s): "GLUCAP" in the last 168 hours. D-Dimer: No results for input(s): "DDIMER" in the last 72 hours. Hgb A1c: No results for input(s): "HGBA1C" in the last 72 hours. Lipid Profile: No results for input(s): "CHOL", "HDL", "LDLCALC", "TRIG", "CHOLHDL", "LDLDIRECT" in the last 72 hours. Thyroid function studies: No results for input(s): "TSH", "T4TOTAL", "T3FREE", "THYROIDAB" in the last 72 hours.  Invalid input(s): "FREET3" Anemia work up: No results for input(s): "VITAMINB12", "FOLATE", "FERRITIN", "TIBC", "IRON", "RETICCTPCT" in the last 72 hours. Sepsis Labs: Recent Labs  Lab 10/16/23 1009 10/17/23 0507 10/18/23 0438 10/19/23 0525  WBC 7.2 7.6 8.8 7.1   Microbiology No results found for this or any previous visit (from the past 240 hour(s)).   Medications:    sodium chloride   Intravenous Once   acyclovir  400 mg Oral TID   feeding  supplement  1 Container Oral TID BM   pantoprazole (PROTONIX) IV  40 mg Intravenous Q12H   Continuous Infusions:    LOS: 5 days   Marinda Elk  [facility]  10/21/2023, 10:35 AM

## 2023-10-21 NOTE — TOC Progression Note (Signed)
Transition of Care [facility]) - Progression Note    Patient Details  Name: Aaron Harmon MRN: [id number] Date of Birth: 1933/08/30  [facility] CM/SW Contact  Howell Rucks, RN Phone Number: 10/21/2023, 1:41 PM  Clinical Narrative:  Call to Donna-admissions coordinator at [facility], reports  [facility] contact for discharges starting 10/25: Morey Hummingbird, 336 [phone].      Expected Discharge Plan: Skilled Nursing Facility Barriers to Discharge: Continued Medical Work up  Expected Discharge Plan and Services     Post Acute Care Choice: Skilled Nursing Facility Living arrangements for the past 2 months: Independent Living Facility                                       Social Determinants of Health (SDOH) Interventions SDOH Screenings   Food Insecurity: No Food Insecurity (10/15/2023)  Housing: Low Risk  (10/15/2023)  Transportation Needs: No Transportation Needs (10/15/2023)  Utilities: Not At Risk (10/15/2023)  Depression (PHQ2-9): Low Risk  (09/28/2023)  Tobacco Use: Low Risk  (10/15/2023)    Readmission Risk Interventions     No data to display

## 2023-10-22 DIAGNOSIS — D62 Acute posthemorrhagic anemia: Secondary | ICD-10-CM

## 2023-10-22 DIAGNOSIS — E43 Unspecified severe protein-calorie malnutrition: Secondary | ICD-10-CM | POA: Diagnosis not present

## 2023-10-22 DIAGNOSIS — K625 Hemorrhage of anus and rectum: Secondary | ICD-10-CM | POA: Diagnosis not present

## 2023-10-22 LAB — BPAM RBC
Blood Product Expiration Date: 202411142359
Blood Product Expiration Date: 202411142359
ISSUE DATE / TIME: 202410241313
ISSUE DATE / TIME: 202410241630
Unit Type and Rh: 9500
Unit Type and Rh: 9500

## 2023-10-22 LAB — HEMOGLOBIN AND HEMATOCRIT, BLOOD
HCT: 28.7 % — ABNORMAL LOW (ref 39.0–52.0)
Hemoglobin: 9.8 g/dL — ABNORMAL LOW (ref 13.0–17.0)

## 2023-10-22 LAB — TYPE AND SCREEN
ABO/RH(D): O NEG
Antibody Screen: NEGATIVE
Unit division: 0
Unit division: 0

## 2023-10-22 MED ORDER — ACYCLOVIR 400 MG PO TABS: 400.0000 mg | ORAL_TABLET | Freq: Three times a day (TID) | ORAL | Status: AC

## 2023-10-22 NOTE — Progress Notes (Signed)
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  Sligfhtly anxious/nervous with concerns about his BP dropping and experiencing mild dizziness whith position change. Assisted OOB to recliner and allowed to sit an extended time  before attempting gait.  General transfer comment: VC's on proper hand placement and safety as pt was inpulsive then pt was c/o dizziness "Fuzzy". General Gait Details: tolerated an increased distance with interval gait distance of 20 feet, 22 feet, then 42 feet with seated rest breaks between. all while monitoring BP's.  BP increased with increased activity and c/o dizziness resolved.  Pt will need ST Rehab at SNF to address mobility and functional decline prior to safely returning home.    If plan is discharge home, recommend the following: A lot of help with walking and/or transfers;A lot of help with bathing/dressing/bathroom;Assistance with cooking/housework;Help with stairs or ramp for entrance   Can travel by private vehicle     No  Equipment Recommendations  None recommended by PT    Recommendations for Other Services       Precautions / Restrictions Precautions Precautions: Fall Restrictions Weight Bearing Restrictions: No     Mobility  Bed Mobility Overal bed mobility: Needs Assistance Bed Mobility: Supine to Sit     Supine to sit: Supervision     General bed mobility comments: supv, no physical assist, increased time and effort    Transfers Overall transfer level: Needs assistance Equipment used: Rolling walker (2 wheels) Transfers: Sit to/from  Stand Sit to Stand: Min assist           General transfer comment: VC's on proper hand placement and safety as pt was inpulsive then pt was c/o dizziness "Fuzzy".    Ambulation/Gait Ambulation/Gait assistance: Min assist Gait Distance (Feet): 84 Feet (20, 22, 42 feet) Assistive device: Rolling walker (2 wheels) Gait Pattern/deviations: Step-to pattern Gait velocity: decreased     General Gait Details: tolerated an increased distance with interval gait distance of 20 feet, 22 feet, then 42 feet with seated rest breaks between. all while monitoring BP's.  BP increased with increased activity and c/o dizziness resolved.   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  Sligfhtly anxious/nervous with concerns about his BP dropping and experiencing mild dizziness whith position change.        Exercises      General Comments  Pertinent Vitals/Pain Pain Assessment Pain Assessment: No/denies pain    Home Living                          Prior Function            PT Goals (current goals can now be found in the care plan section) Progress towards PT goals: Progressing toward goals    Frequency    Min 1X/week      PT Plan      Co-evaluation              AM-PAC PT "6 Clicks" Mobility   Outcome Measure  Help needed turning from your back to your side while in a flat bed without using bedrails?: A Little Help needed moving from lying on your back to sitting on the side of a flat bed without using bedrails?: A Little Help needed moving to and from a bed to a chair  (including a wheelchair)?: A Little Help needed standing up from a chair using your arms (e.g., wheelchair or bedside chair)?: A Little Help needed to walk in hospital room?: A Lot Help needed climbing 3-5 steps with a railing? : Total 6 Click Score: 15    End of Session Equipment Utilized During Treatment: Gait belt Activity Tolerance: Patient tolerated treatment well Patient left: in chair;with call bell/phone within reach;with chair alarm set;with family/visitor present Nurse Communication: Mobility status PT Visit Diagnosis: Difficulty in walking, not elsewhere classified (R26.2);Muscle weakness (generalized) (M62.81)     Time: 4098-1191 PT Time Calculation (min) (ACUTE ONLY): 24 min  Charges:    $Gait Training: 8-22 mins $Therapeutic Activity: 8-22 mins PT General Charges $$ ACUTE PT VISIT: 1 Visit                     Felecia Shelling  PTA Acute  Rehabilitation Services Office M-F          [phone]

## 2023-10-22 NOTE — TOC Transition Note (Signed)
Transition of Care [facility]) - CM/SW Discharge Note   Patient Details  Name: Aaron Harmon MRN: [id number] Date of Birth: Jan 28, 1933  Transition of Care [facility]) CM/SW Contact:  Howell Rucks, RN Phone Number: 10/22/2023, 12:33 PM   Clinical Narrative:  DC order to short term rehab/SNF at [facility]. Call to Ciarra w/admissions at [phone], confirmed bed available for transfer today, RM 151, Nurse Report [phone]. [facility] for transport. No further TOC needs identified.      Final next level of care: Skilled Nursing Facility Barriers to Discharge: Barriers Resolved   Patient Goals and CMS Choice CMS Medicare.gov Compare Post Acute Care list provided to:: Patient Represenative (must comment) ([place] (Spouse)  [phone] (Mobile))    Discharge Placement                Patient chooses bed at: [facility] Patient to be transferred to facility by: [facility]   Patient and family notified of of transfer: 10/22/23  Discharge Plan and Services Additional resources added to the After Visit Summary for       Post Acute Care Choice: Skilled Nursing Facility                               Social Determinants of Health (SDOH) Interventions SDOH Screenings   Food Insecurity: No Food Insecurity (10/15/2023)  Housing: Low Risk  (10/15/2023)  Transportation Needs: No Transportation Needs (10/15/2023)  Utilities: Not At Risk (10/15/2023)  Depression (PHQ2-9): Low Risk  (09/28/2023)  Tobacco Use: Low Risk  (10/15/2023)     Readmission Risk Interventions     No data to display

## 2023-10-22 NOTE — Discharge Summary (Signed)
Physician Discharge Summary  Aaron Harmon JR XLK:[id number] DOB: Jan 23, 1933 DOA: 10/15/2023  PCP: [name], MD  Admit date: 10/15/2023 Discharge date: 10/22/2023  Admitted From: Home Disposition:  SNF  Recommendations for Outpatient Follow-up:  Follow up with PCP in 1-2 weeks Please obtain BMP/CBC in one week   Home Health:No Equipment/Devices:None  Discharge Condition:Stable CODE STATUS:DNR  Diet recommendation: Heart Healthy  Brief/Interim Summary: 89 y.o. male past medical history significant mitral valve prolapse, history of prostate cancer, known diverticulosis to the hospital with bright red blood per rectum and disimpacted himself couple of days ago after which she started having bright red blood per rectum intermittently.  In the ED he was noted to be orthostatic due to his recent weight loss CT scan of the abdomen pelvis was done.  GI was consulted recommended conservative management.  As his hemoglobin slowly drifted down he was transfused 2 units of packed red blood cells CT of the abdomen pelvis showed localized bleeding however interventional radiology reviewed the image and send there is no extravasation they felt no intervention was warranted.    Discharge Diagnoses:  Principal Problem:   BRBPR (bright red blood per rectum) Active Problems:   Protein-calorie malnutrition, severe  Lower GI bleed/bright red blood per rectum/acute blood loss anemia: This is is likely diverticular bleed: Hemoglobin came down to 7.3 was transfused 4 units of packed red blood cells GI was consulted recommended conservative management. IR was consulted recommended a CT angio before proceeding with intervention. CT angio of the abdomen pelvis showed no evidence of bleeding.  He had no episode of further bleeding. His hemoglobin was monitored treated conservatively and his bleeding stopped. On day of discharge hemoglobin is 9.7.  Dizziness upon standing/light headedness upon  standing: Orthostatics have been negative likely deconditioning and probably anemia. Physical therapy evaluated the patient he will go back to skilled nursing facility at [facility].  Acute blood loss anemia: Try to keep hemoglobin greater than 8.  Weight loss: With decreased appetite, PT evaluated the patient will need rehab.  Generalized weakness: Follow-up with skilled nursing facility.  History of prostate cancer: Follow-up with urology as an outpatient.  Constipation: Now resolved.   Metabolic acidosis: Resolved.  Herpetic lesion: Will continue acyclovir he has refused this several times, will try to complete a 7-day course.  Severe protein caloric malnutrition/hyperlipidemia: Noted.   Discharge Instructions  Discharge Instructions     Diet - low sodium heart healthy   Complete by: As directed    Increase activity slowly   Complete by: As directed       Allergies as of 10/22/2023       Reactions   Ciprofloxacin Other (See Comments)   Reaction not confirmed        Medication List     TAKE these medications    acyclovir 400 MG tablet Commonly known as: ZOVIRAX Take 1 tablet (400 mg total) by mouth 3 (three) times daily for 7 days.   polyethylene glycol 17 g packet Commonly known as: MIRALAX / GLYCOLAX Take 17 g by mouth daily as needed for mild constipation.   senna 8.6 MG Tabs tablet Commonly known as: SENOKOT Take 1 tablet by mouth at bedtime as needed for mild constipation.   TYLENOL 500 MG tablet Generic drug: acetaminophen Take 500 mg by mouth every 6 (six) hours as needed for mild pain (pain score 1-3) or headache.        Contact information for after-discharge care     Destination  [facility] .   Service: Skilled Nursing Contact information: [address] [place] [address] [phone]                    Allergies  Allergen Reactions   Ciprofloxacin  Other (See Comments)    Reaction not confirmed    Consultations: Gastroenterology Interventional radiology   Procedures/Studies: CT ANGIO GI BLEED  Result Date: 10/17/2023 CLINICAL DATA:  Lower GI bleed. EXAM: CTA ABDOMEN AND PELVIS WITHOUT AND WITH CONTRAST TECHNIQUE: Multidetector CT imaging of the abdomen and pelvis was performed using the standard protocol during bolus administration of intravenous contrast. Multiplanar reconstructed images and MIPs were obtained and reviewed to evaluate the vascular anatomy. RADIATION DOSE REDUCTION: This exam was performed according to the departmental dose-optimization program which includes automated exposure control, adjustment of the mA and/or kV according to patient size and/or use of iterative reconstruction technique. CONTRAST:  OMNIPAQUE IOHEXOL 350 MG/ML SOLN COMPARISON:  CT of the abdomen and pelvis 10/14/2023 FINDINGS: VASCULAR Aorta: Diffuse atherosclerotic irregularity is present throughout the abdominal aorta. No aneurysm or stenosis is present. Celiac: Mild narrowing of the proximal celiac artery measures 3 mm, approximately 50% of the caliber of the more distal vessel. The branch vessels are within normal limits. SMA: SMA origin is within normal limits. Atherosclerotic disease is present with 50% narrowing more distally secondary to noncalcified plaque. Renals: Mild calcifications are present proximally without significant stenosis in the renal arteries. IMA: A moderate proximal stenosis is present. The distal branch vessels are within normal limits. Inflow: Atherosclerotic changes are present without focal stenosis. Proximal Outflow: Atherosclerotic changes are present. No focal stenosis or aneurysm is present. Veins: Within normal limits. The arterial and delayed portal venous phase images demonstrate asymmetric focal enhancement in the proximal descending colon. No pooling contrast is evident. Review of the MIP images confirms the above  findings. NON-VASCULAR Lower chest: The lung bases are clear. Coronary artery calcifications are present. Hepatobiliary: Cystic lesions are present in the liver bilaterally, measuring 11 mm in the left lobe and 7 mm in the right lobe. The gallbladder and common bile duct are within normal limits. Pancreas: Unremarkable. No pancreatic ductal dilatation or surrounding inflammatory changes. Spleen: A 2.7 cm simple cyst is present in the superior aspect of the spleen. Adrenals/Urinary Tract: The adrenal glands are normal bilaterally. Bilateral renal cysts are again noted. A staghorn calculus of the left renal collecting system is stable. The distal ureters are within normal limits bilaterally. The urinary bladder is normal. Stomach/Bowel: Stomach and duodenum are within normal limits. The small bowel is unremarkable. Terminal ileum is within normal limits. The appendix is visualized and normal. The ascending and transverse colon are normal. Diverticular changes are present in the distal descending and sigmoid colon. No inflammatory changes are present to suggest diverticulitis. Lymphatic: No significant adenopathy is present. Reproductive: Prostate is unremarkable. Other: No abdominal wall hernia or abnormality. No abdominopelvic ascites. Musculoskeletal: Grade 1 anterolisthesis at L4-5 is stable. The bony pelvis is normal. The hips are located and within normal limits bilaterally. IMPRESSION: 1. Asymmetric focal enhancement in the proximal descending colon. Although there is no pooling blood, this may reflect focal edema and intermittent hemorrhage. No other source of hemorrhage is identified. 2. Coronary artery disease. 3. 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 4. Stable bilateral renal cysts. 5. Stable hepatic cysts. 6. Stable simple cyst in the superior aspect of the spleen. 7.  Aortic Atherosclerosis (ICD10-I70.0). Electronically  Signed   By: Marin Roberts M.D.   On: 10/17/2023 13:33    CT CHEST ABDOMEN PELVIS W CONTRAST  Result Date: 10/16/2023 CLINICAL DATA:  Chronic dyspnea. Chest wall or pleural disease suspected. Unintentional weight loss. Diarrhea and abdominal pain. EXAM: CT CHEST, ABDOMEN, AND PELVIS WITH CONTRAST TECHNIQUE: Multidetector CT imaging of the chest, abdomen and pelvis was performed following the standard protocol during bolus administration of intravenous contrast. RADIATION DOSE REDUCTION: This exam was performed according to the departmental dose-optimization program which includes automated exposure control, adjustment of the mA and/or kV according to patient size and/or use of iterative reconstruction technique. CONTRAST:  OMNIPAQUE IOHEXOL 300 MG/ML  SOLN COMPARISON:  Abdominopelvic CT a 01/20/2022 FINDINGS: CT CHEST FINDINGS Cardiovascular: The heart is normal in size. There are coronary artery calcifications. Atherosclerosis of the thoracic aorta without acute aortic findings. Left vertebral artery arises directly from the thoracic aorta, variant anatomy. No central pulmonary embolus. No pericardial effusion. Mediastinum/Nodes: No mediastinal or hilar adenopathy. Decompressed esophagus. 7 mm hypodense left thyroid nodule. Not clinically significant; no follow-up imaging recommended (ref: J Am Coll Radiol. 2015 Feb;12(2): 143-50). Lungs/Pleura: No pleural effusion or thickening. No focal airspace disease. Minor subsegmental atelectasis or scarring in the dependent right greater than left lower lobe. No features of pulmonary edema. No suspicious pulmonary nodule. The trachea and central airways are clear. Musculoskeletal: There are no acute or suspicious osseous abnormalities. No chest wall soft tissue abnormality. CT ABDOMEN PELVIS FINDINGS Hepatobiliary: Stable subcentimeter hypodensity in the right lobe of the liver, likely small cyst or hemangioma. Additional subcentimeter hypodensities in the left hepatic lobe, 1 of which has a calcification, are also  unchanged. These are too small to accurately characterize but likely small cysts or hemangiomas. There is no new or suspicious liver lesion. Unremarkable gallbladder. No biliary dilatation. Pancreas: Parenchymal atrophy. No ductal dilatation or inflammation. Spleen: No splenomegaly.  Stable cyst in the inferior spleen. Adrenals/Urinary Tract: No adrenal nodule. Staghorn calculus in the left renal pelvis measuring 2.8 x 2 cm. There is mild fullness of the calices but no frank hydronephrosis. No perirenal inflammation. There are bilateral renal cysts as well as low-density lesions too small to characterize. No further follow-up imaging is recommended. There is no evidence of solid renal lesion. Unremarkable urinary bladder. Stomach/Bowel: Detailed bowel assessment is limited in the absence of enteric contrast. The stomach is unremarkable. There is a duodenal diverticulum. No small bowel obstruction or small bowel inflammation. Scattered areas of fecalization of small bowel contents. Appendix not confidently seen. Moderate volume of stool throughout the colon. Moderate diverticulosis involving the distal transverse colon distally. No diverticulitis or colonic inflammatory change. There is no colonic wall thickening. Small volume of stool in the rectum. No definite rectal wall thickening. Vascular/Lymphatic: Advanced aortic atherosclerosis. Calcified noncalcified atheromatous plaque. 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 Surgical clips adjacent to the external iliac vessels. No abdominal or pelvic adenopathy. Reproductive: Prostatic calcifications. Other: Tiny fat containing umbilical hernia. Postsurgical change of the lower anterior abdominal wall. No ascites or free air. Musculoskeletal: Grade 1 anterolisthesis of L4 on L5 which is likely facet mediated. No focal bone lesion or acute osseous findings. IMPRESSION: 1. No acute abnormality in the chest, abdomen, or pelvis. 2. No pleural  effusion or explanation for dyspnea. 3. Staghorn calculus in the left renal pelvis measuring 2.8 x 2 cm. There is mild chronic fullness of the calices but no frank hydronephrosis or renal inflammation. 4. Colonic  diverticulosis without diverticulitis. 5. Moderate colonic stool burden with scattered areas of fecalization of small bowel contents, suggesting slow transit. No liquid stool or colonic inflammation. Aortic Atherosclerosis (ICD10-I70.0). Electronically Signed   By: Narda Rutherford M.D.   On: 10/16/2023 16:31   (Echo, Carotid, EGD, Colonoscopy, ERCP)    Subjective: No complaints  Discharge Exam: Vitals:   10/21/23 1938 10/22/23 0502  BP: (!) 117/47 125/61  Pulse: 79 (!) 59  Resp: 16 16  Temp: 98.2 F (36.8 C) 98.1 F (36.7 C)  SpO2: 99% 97%   Vitals:   10/21/23 1637 10/21/23 1659 10/21/23 1938 10/22/23 0502  BP: (!) 122/59 (!) 133/54 (!) 117/47 125/61  Pulse: 70 73 79 (!) 59  Resp: 16 18 16 16   Temp: 97.9 F (36.6 C) 97.6 F (36.4 C) 98.2 F (36.8 C) 98.1 F (36.7 C)  TempSrc: Oral Oral Oral Oral  SpO2: 99% 100% 99% 97%  Weight:      Height:        General: Pt is alert, awake, not in acute distress Cardiovascular: RRR, S1/S2 +, no rubs, no gallops Respiratory: CTA bilaterally, no wheezing, no rhonchi Abdominal: Soft, NT, ND, bowel sounds + Extremities: no edema, no cyanosis    The results of significant diagnostics from this hospitalization (including imaging, microbiology, ancillary and laboratory) are listed below for reference.     Microbiology: No results found for this or any previous visit (from the past 240 hour(s)).   Labs: BNP (last 3 results) No results for input(s): "BNP" in the last 8760 hours. Basic Metabolic Panel: Recent Labs  Lab 10/16/23 1009 10/17/23 0507 10/18/23 0438 10/19/23 0525 10/20/23 1153 10/21/23 0454  NA 139 138 138 139 138 139  K 3.8 3.9 4.0 3.5 3.0* 4.4  CL 109 112* 112* 112* 111 112*  CO2 23 20* 20* 21* 19* 21*   GLUCOSE 106* 105* 90 97 117* 95  BUN 27* 28* 34* 34* 30* 26*  CREATININE 0.78 0.94 0.85 1.05 0.93 1.01  CALCIUM 8.4* 8.2* 7.9* 8.1* 7.8* 8.0*  MG 2.1 2.1 2.1 1.9  --   --   PHOS 3.5 3.6 3.7 4.2  --   --    Liver Function Tests: Recent Labs  Lab 10/15/23 1635 10/16/23 1009 10/17/23 0507 10/18/23 0438 10/19/23 0525  AST 18 15 14* 14* 13*  ALT 12 11 9 10 12   ALKPHOS 53 42 41 39 39  BILITOT 0.7 0.7 0.6 1.1 0.8  PROT 6.4* 5.4* 5.1* 4.6* 4.8*  ALBUMIN 3.6 3.1* 3.0* 2.7* 2.7*   No results for input(s): "LIPASE", "AMYLASE" in the last 168 hours. No results for input(s): "AMMONIA" in the last 168 hours. CBC: Recent Labs  Lab 10/15/23 1635 10/15/23 1848 10/16/23 1009 10/16/23 1856 10/17/23 0507 10/17/23 1017 10/18/23 0438 10/18/23 0922 10/19/23 0525 10/19/23 1405 10/20/23 0733 10/20/23 1153 10/21/23 0624 10/21/23 0914 10/21/23 2150  WBC 9.3  --  7.2  --  7.6  --  8.8  --  7.1  --   --   --   --   --   --   NEUTROABS  --   --  5.0  --  4.6  --  6.1  --  4.4  --   --   --   --   --   --   HGB 11.4*   < > 9.3*   < > 7.7*   < > 9.1*   < > 8.3*   < > 8.0* 8.0*  7.3* 7.4* 9.7*  HCT 35.3*   < > 29.4*   < > 24.4*   < > 27.0*   < > 24.3*   < > 24.1* 24.0* 21.9* 23.4* 28.6*  MCV 97.8  --  98.7  --  100.4*  --  93.8  --  93.1  --   --   --   --   --   --   PLT 224  --  214  --  188  --  169  --  181  --   --   --   --   --   --    < > = values in this interval not displayed.   Cardiac Enzymes: No results for input(s): "CKTOTAL", "CKMB", "CKMBINDEX", "TROPONINI" in the last 168 hours. BNP: Invalid input(s): "POCBNP" CBG: No results for input(s): "GLUCAP" in the last 168 hours. D-Dimer No results for input(s): "DDIMER" in the last 72 hours. Hgb A1c No results for input(s): "HGBA1C" in the last 72 hours. Lipid Profile No results for input(s): "CHOL", "HDL", "LDLCALC", "TRIG", "CHOLHDL", "LDLDIRECT" in the last 72 hours. Thyroid function studies No results for input(s): "TSH",  "T4TOTAL", "T3FREE", "THYROIDAB" in the last 72 hours.  Invalid input(s): "FREET3" Anemia work up No results for input(s): "VITAMINB12", "FOLATE", "FERRITIN", "TIBC", "IRON", "RETICCTPCT" in the last 72 hours. Urinalysis    Component Value Date/Time   COLORURINE YELLOW 03/26/2022 1529   APPEARANCEUR CLEAR 03/26/2022 1529   LABSPEC 1.024 03/26/2022 1529   PHURINE 5.5 03/26/2022 1529   GLUCOSEU NEGATIVE 03/26/2022 1529   HGBUR MODERATE (A) 03/26/2022 1529   BILIRUBINUR NEGATIVE 03/26/2022 1529   KETONESUR NEGATIVE 03/26/2022 1529   PROTEINUR TRACE (A) 03/26/2022 1529   NITRITE NEGATIVE 03/26/2022 1529   LEUKOCYTESUR NEGATIVE 03/26/2022 1529   Sepsis Labs Recent Labs  Lab 10/16/23 1009 10/17/23 0507 10/18/23 0438 10/19/23 0525  WBC 7.2 7.6 8.8 7.1   Microbiology No results found for this or any previous visit (from the past 240 hour(s)).   Time coordinating discharge: Over 35 minutes  SIGNED:   Marinda Elk, MD  [facility] 10/22/2023, 8:18 AM Pager   If 7PM-7AM, please contact night-coverage [email or web address] Password [email or web address]

## 2023-10-22 NOTE — Care Management Important Message (Signed)
Important Message  Patient Details IM Letter given. Name: Aaron Harmon MRN: [id number] Date of Birth: [birth year]   Important Message Given:  Yes - Medicare IM     Caren Macadam 10/22/2023, 9:32 AM

## 2023-10-25 ENCOUNTER — Encounter: Payer: Self-pay | Admitting: Internal Medicine

## 2023-10-25 ENCOUNTER — Non-Acute Institutional Stay (SKILLED_NURSING_FACILITY): Payer: Medicare Other | Admitting: Internal Medicine

## 2023-10-25 DIAGNOSIS — K922 Gastrointestinal hemorrhage, unspecified: Secondary | ICD-10-CM | POA: Diagnosis not present

## 2023-10-25 DIAGNOSIS — R634 Abnormal weight loss: Secondary | ICD-10-CM

## 2023-10-25 DIAGNOSIS — R531 Weakness: Secondary | ICD-10-CM

## 2023-10-25 DIAGNOSIS — K5901 Slow transit constipation: Secondary | ICD-10-CM | POA: Diagnosis not present

## 2023-10-25 DIAGNOSIS — F330 Major depressive disorder, recurrent, mild: Secondary | ICD-10-CM

## 2023-10-25 DIAGNOSIS — R42 Dizziness and giddiness: Secondary | ICD-10-CM

## 2023-10-25 DIAGNOSIS — R4189 Other symptoms and signs involving cognitive functions and awareness: Secondary | ICD-10-CM
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
## 2023-10-26 ENCOUNTER — Encounter: Payer: Medicare Other | Admitting: Internal Medicine

## 2023-10-26 LAB — CBC AND DIFFERENTIAL
HCT: 32 — AB (ref 41–53)
Hemoglobin: 10.9 — AB (ref 13.5–17.5)
Platelets: 204 10*3/uL (ref 150–400)
WBC: 6.7

## 2023-10-26 LAB — CBC: RBC: 3.4 — AB (ref 3.87–5.11)

## 2023-11-03 ENCOUNTER — Non-Acute Institutional Stay (SKILLED_NURSING_FACILITY): Payer: Medicare Other | Admitting: Internal Medicine

## 2023-11-03 ENCOUNTER — Encounter: Payer: Self-pay | Admitting: Internal Medicine

## 2023-11-03 DIAGNOSIS — F32A Depression, unspecified: Secondary | ICD-10-CM
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
## 2023-12-02 ENCOUNTER — Non-Acute Institutional Stay (SKILLED_NURSING_FACILITY): Payer: Medicare Other | Admitting: Adult Health

## 2023-12-02 ENCOUNTER — Encounter: Payer: Self-pay | Admitting: Adult Health

## 2023-12-02 DIAGNOSIS — I341 Nonrheumatic mitral (valve) prolapse: Secondary | ICD-10-CM

## 2023-12-02 DIAGNOSIS — F32A Depression, unspecified: Secondary | ICD-10-CM

## 2023-12-02 DIAGNOSIS — K922 Gastrointestinal hemorrhage, unspecified: Secondary | ICD-10-CM | POA: Diagnosis not present

## 2023-12-02 DIAGNOSIS — R531 Weakness: Secondary | ICD-10-CM

## 2023-12-02 DIAGNOSIS — K5901 Slow transit constipation: Secondary | ICD-10-CM

## 2023-12-02 DIAGNOSIS — E43 Unspecified severe protein-calorie malnutrition: Secondary | ICD-10-CM

## 2023-12-02 MED ORDER — BOOST BREEZE PO LIQD: 1.0000 | Freq: Two times a day (BID) | ORAL | Status: DC
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
## 2024-01-01 LAB — CBC AND DIFFERENTIAL
HCT: 32 — AB (ref 41–53)
Hemoglobin: 11.1 — AB (ref 13.5–17.5)
Platelets: 203 10*3/uL (ref 150–400)
WBC: 6.8

## 2024-01-01 LAB — BASIC METABOLIC PANEL
BUN: 27 — AB (ref 4–21)
CO2: 25 — AB (ref 13–22)
Chloride: 108 (ref 99–108)
Creatinine: 1.1 (ref 0.6–1.3)
Glucose: 86
Potassium: 4.4 meq/L (ref 3.5–5.1)
Sodium: 141 (ref 137–147)

## 2024-01-01 LAB — COMPREHENSIVE METABOLIC PANEL
Calcium: 8.6 — AB (ref 8.7–10.7)
eGFR: 66

## 2024-01-01 LAB — CBC: RBC: 3.45 — AB (ref 3.87–5.11)

## 2024-01-24 ENCOUNTER — Encounter: Payer: Self-pay | Admitting: Internal Medicine

## 2024-01-25 ENCOUNTER — Encounter: Payer: Self-pay | Admitting: Internal Medicine

## 2024-01-25 ENCOUNTER — Non-Acute Institutional Stay: Payer: Medicare Other | Admitting: Internal Medicine

## 2024-01-25 VITALS — BP 133/86 | HR 74 | Temp 97.7°F | Resp 16 | Ht 73.0 in | Wt 159.2 lb

## 2024-01-25 DIAGNOSIS — K5901 Slow transit constipation: Secondary | ICD-10-CM | POA: Diagnosis not present

## 2024-01-25 DIAGNOSIS — I341 Nonrheumatic mitral (valve) prolapse: Secondary | ICD-10-CM | POA: Diagnosis not present

## 2024-01-25 DIAGNOSIS — F32A Depression, unspecified: Secondary | ICD-10-CM

## 2024-01-25 DIAGNOSIS — H6123 Impacted cerumen, bilateral: Secondary | ICD-10-CM

## 2024-01-25 DIAGNOSIS — R4189 Other symptoms and signs involving cognitive functions and awareness: Secondary | ICD-10-CM | POA: Diagnosis not present
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
## 2024-01-27 ENCOUNTER — Encounter: Payer: Self-pay | Admitting: Internal Medicine

## 2024-01-28 ENCOUNTER — Emergency Department (HOSPITAL_BASED_OUTPATIENT_CLINIC_OR_DEPARTMENT_OTHER)
Admission: EM | Admit: 2024-01-28 | Discharge: 2024-01-29 | Disposition: A | Discharge status: Home / Self Care | Payer: Medicare Other | Attending: Emergency Medicine | Admitting: Emergency Medicine

## 2024-01-28 ENCOUNTER — Other Ambulatory Visit: Payer: Self-pay

## 2024-01-28 ENCOUNTER — Encounter (HOSPITAL_BASED_OUTPATIENT_CLINIC_OR_DEPARTMENT_OTHER): Payer: Self-pay

## 2024-01-28 DIAGNOSIS — J101 Influenza due to other identified influenza virus with other respiratory manifestations: Secondary | ICD-10-CM | POA: Diagnosis not present

## 2024-01-28 DIAGNOSIS — Z20822 Contact with and (suspected) exposure to covid-19: Secondary | ICD-10-CM | POA: Insufficient documentation

## 2024-01-28 DIAGNOSIS — R509 Fever, unspecified: Secondary | ICD-10-CM | POA: Diagnosis present

## 2024-01-28 LAB — RESP PANEL BY RT-PCR (RSV, FLU A&B, COVID)  RVPGX2
Influenza A by PCR: POSITIVE — AB
Influenza B by PCR: NEGATIVE
Resp Syncytial Virus by PCR: NEGATIVE
SARS Coronavirus 2 by RT PCR: NEGATIVE

## 2024-01-28 MED ORDER — OSELTAMIVIR PHOSPHATE 75 MG PO CAPS: 75.0000 mg | ORAL_CAPSULE | Freq: Two times a day (BID) | ORAL | 0 refills | Status: DC

## 2024-01-28 MED ORDER — ACETAMINOPHEN 325 MG PO TABS
650.0000 mg | ORAL_TABLET | Freq: Once | ORAL | Status: AC
  Administered 2024-01-28: 650 mg via ORAL
  Filled 2024-01-28: qty 2

## 2024-01-28 NOTE — ED Triage Notes (Signed)
Flu-like s/s x 2 days with fever, and general weakness

## 2024-01-28 NOTE — ED Provider Notes (Signed)
[facility] AT [facility] Provider Note   CSN: [id number] Arrival date & time: 01/28/24  2238     History  Chief Complaint  Patient presents with   Cough   Fever   Influenza    Aaron Harmon is a 89 y.o. male.  The history is provided by the patient.  Cough Cough characteristics:  Non-productive Severity:  Mild Onset quality:  Gradual Timing:  Intermittent Progression:  Unchanged Chronicity:  New Context: upper respiratory infection   Relieved by:  Nothing Worsened by:  Nothing Ineffective treatments:  None tried Associated symptoms: fever   Associated symptoms: no sinus congestion, no sore throat, no weight loss and no wheezing   Fever Associated symptoms: cough   Associated symptoms: no sore throat   Influenza Presenting symptoms: cough and fever   Presenting symptoms: no sore throat        Home Medications Prior to Admission medications   Medication Sig Start Date End Date Taking? Authorizing Provider  oseltamivir (TAMIFLU) 75 MG capsule Take 1 capsule (75 mg total) by mouth every 12 (twelve) hours. 01/28/24  Yes Guled Gahan, MD  aluminum-magnesium hydroxide-simethicone (MAALOX) 200-200-20 MG/5ML SUSP Take 30 mLs by mouth every 4 (four) hours as needed.    [provider]  buPROPion (WELLBUTRIN XL) 150 MG 24 hr tablet Take 150 mg by mouth daily.    [provider]  Nutritional Supplements (FEEDING SUPPLEMENT, BOOST BREEZE,) LIQD Take 1 Container by mouth in the morning and at bedtime. 12/02/23   Fletcher Anon, NP  polyethylene glycol (MIRALAX / GLYCOLAX) 17 g packet Take 17 g by mouth daily.    [provider]  senna (SENOKOT) 8.6 MG TABS tablet Take 1 tablet by mouth at bedtime as needed for mild constipation.    [provider]  TYLENOL 500 MG tablet Take 500 mg by mouth every 6 (six) hours as needed for mild pain (pain score 1-3) or headache.    [provider]      Allergies     Ciprofloxacin    Review of Systems   Review of Systems  Constitutional:  Positive for fever. Negative for weight loss.  HENT:  Negative for sore throat.   Respiratory:  Positive for cough. Negative for wheezing.   All other systems reviewed and are negative.   Physical Exam Updated Vital Signs BP (!) 172/70   Pulse 79   Temp (!) 100.6 F (38.1 C) (Oral)   Resp 20   Ht 6\' 1"  (1.854 m)   Wt 72.2 kg   SpO2 95%   BMI 21.00 kg/m  Physical Exam Vitals and nursing note reviewed.  Constitutional:      General: He is not in acute distress.    Appearance: Normal appearance. He is well-developed. He is not diaphoretic.  HENT:     Head: Normocephalic and atraumatic.     Nose: Nose normal.  Eyes:     Conjunctiva/sclera: Conjunctivae normal.     Pupils: Pupils are equal, round, and reactive to light.  Cardiovascular:     Rate and Rhythm: Normal rate and regular rhythm.     Pulses: Normal pulses.     Heart sounds: Normal heart sounds.  Pulmonary:     Effort: Pulmonary effort is normal. No respiratory distress.     Breath sounds: Normal breath sounds. No stridor. No wheezing, rhonchi or rales.  Chest:     Chest wall: No tenderness.  Abdominal:     General: Bowel  sounds are normal.     Palpations: Abdomen is soft.     Tenderness: There is no abdominal tenderness. There is no guarding or rebound.  Musculoskeletal:        General: Normal range of motion.     Cervical back: Normal range of motion and neck supple.  Skin:    General: Skin is warm and dry.     Capillary Refill: Capillary refill takes less than 2 seconds.  Neurological:     General: No focal deficit present.     Mental Status: He is alert and oriented to person, place, and time.     Deep Tendon Reflexes: Reflexes normal.  Psychiatric:        Mood and Affect: Mood normal.        Behavior: Behavior normal.     ED Results / Procedures / Treatments   Labs (all labs ordered are listed, but only abnormal results  are displayed) Labs Reviewed  RESP PANEL BY RT-PCR (RSV, FLU A&B, COVID)  [id number] - Abnormal; Notable for the following components:      Result Value   Influenza A by PCR POSITIVE (*)    All other components within normal limits    EKG None  Radiology No results found.  Procedures Procedures    Medications Ordered in ED Medications  acetaminophen (TYLENOL) tablet 650 mg (650 mg Oral Given 01/28/24 2259)    ED Course/ Medical Decision Making/ A&P                                 Medical Decision Making Patient with cough and fever x 2 days   Amount and/or Complexity of Data Reviewed External Data Reviewed: notes.    Details: Previous notes reviewed  Labs: ordered.    Details: Positive flu A  Risk OTC drugs. Prescription drug management. Risk Details: Lungs are clear, well appearing.  Normal vital signs.  Will start tamiflu to reduce symptoms.  Stable for discharge.  Strict return precautions given     Final Clinical Impression(s) / ED Diagnoses Final diagnoses:  Influenza A   Return for intractable cough, coughing up blood, fevers > 100.4 unrelieved by medication, shortness of breath, intractable vomiting, chest pain, shortness of breath, weakness, numbness, changes in speech, facial asymmetry, abdominal pain, passing out, Inability to tolerate liquids or food, cough, altered mental status or any concerns. No signs of systemic illness or infection. The patient is nontoxic-appearing on exam and vital signs are within normal limits.  I have reviewed the triage vital signs and the nursing notes. Pertinent labs & imaging results that were available during my care of the patient were reviewed by me and considered in my medical decision making (see chart for details). After history, exam, and medical workup I feel the patient has been appropriately medically screened and is safe for discharge home. Pertinent diagnoses were discussed with the patient. Patient was given return  precautions.  Rx / DC Orders ED Discharge Orders          Ordered    oseltamivir (TAMIFLU) 75 MG capsule  Every 12 hours        01/28/24 2341              Niani Mourer, MD 01/28/24 2346

## 2024-01-29 NOTE — ED Notes (Signed)
Patient does not know a Mardene Celeste and wife notified

## 2024-01-31 ENCOUNTER — Non-Acute Institutional Stay (SKILLED_NURSING_FACILITY): Payer: Self-pay | Admitting: Adult Health

## 2024-01-31 ENCOUNTER — Encounter: Payer: Self-pay | Admitting: Adult Health

## 2024-01-31 DIAGNOSIS — J101 Influenza due to other identified influenza virus with other respiratory manifestations: Secondary | ICD-10-CM | POA: Diagnosis not present

## 2024-01-31 DIAGNOSIS — R197 Diarrhea, unspecified: Secondary | ICD-10-CM | POA: Diagnosis not present

## 2024-01-31 DIAGNOSIS — G3184 Mild cognitive impairment, so stated: Secondary | ICD-10-CM

## 2024-01-31 DIAGNOSIS — I951 Orthostatic hypotension: Secondary | ICD-10-CM

## 2024-01-31 LAB — CBC AND DIFFERENTIAL
HCT: 34 — AB (ref 41–53)
Hemoglobin: 11.5 — AB (ref 13.5–17.5)
Platelets: 155 10*3/uL (ref 150–400)
WBC: 3.9

## 2024-01-31 LAB — BASIC METABOLIC PANEL WITH GFR
BUN: 27 — AB (ref 4–21)
CO2: 21 (ref 13–22)
Chloride: 106 (ref 99–108)
Creatinine: 0.9 (ref 0.6–1.3)
Glucose: 79
Potassium: 3.9 meq/L (ref 3.5–5.1)
Sodium: 137 (ref 137–147)

## 2024-01-31 LAB — COMPREHENSIVE METABOLIC PANEL WITH GFR
Calcium: 8 — AB (ref 8.7–10.7)
eGFR: 78

## 2024-01-31 LAB — CBC: RBC: 3.6 — AB (ref 3.87–5.11)
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
## 2024-02-01 ENCOUNTER — Encounter: Payer: Self-pay | Admitting: Orthopedic Surgery

## 2024-02-01 ENCOUNTER — Non-Acute Institutional Stay (SKILLED_NURSING_FACILITY): Payer: Self-pay | Admitting: Orthopedic Surgery

## 2024-02-01 DIAGNOSIS — Z23 Encounter for immunization: Secondary | ICD-10-CM

## 2024-02-01 DIAGNOSIS — F330 Major depressive disorder, recurrent, mild: Secondary | ICD-10-CM

## 2024-02-01 DIAGNOSIS — E43 Unspecified severe protein-calorie malnutrition: Secondary | ICD-10-CM

## 2024-02-01 DIAGNOSIS — I341 Nonrheumatic mitral (valve) prolapse: Secondary | ICD-10-CM

## 2024-02-01 DIAGNOSIS — R531 Weakness: Secondary | ICD-10-CM | POA: Diagnosis not present

## 2024-02-01 DIAGNOSIS — J101 Influenza due to other identified influenza virus with other respiratory manifestations: Secondary | ICD-10-CM | POA: Diagnosis not present
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
## 2024-02-02 ENCOUNTER — Encounter: Payer: Self-pay | Admitting: Adult Health

## 2024-02-08 ENCOUNTER — Non-Acute Institutional Stay: Payer: Medicare Other | Admitting: Internal Medicine

## 2024-02-08 ENCOUNTER — Encounter: Payer: Self-pay | Admitting: Internal Medicine

## 2024-02-08 ENCOUNTER — Telehealth: Payer: Self-pay

## 2024-02-08 VITALS — BP 138/78 | HR 64 | Temp 97.7°F | Resp 17 | Ht 73.0 in | Wt 151.0 lb

## 2024-02-08 DIAGNOSIS — R531 Weakness: Secondary | ICD-10-CM | POA: Diagnosis not present

## 2024-02-08 DIAGNOSIS — L0390 Cellulitis, unspecified: Secondary | ICD-10-CM

## 2024-02-08 NOTE — Telephone Encounter (Signed)
Patients wife called to say patient is off isolation from influenza diagnosis however he is still weak and she needs to now when to expect him to feel better.  [name] also states that patient has a boil that needs to be examined.  Patient to be seen at the [facility] today at 1:40 pm
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
## 2024-03-08 ENCOUNTER — Non-Acute Institutional Stay: Payer: Self-pay | Admitting: Orthopedic Surgery

## 2024-03-08 ENCOUNTER — Encounter: Payer: Self-pay | Admitting: Orthopedic Surgery

## 2024-03-08 DIAGNOSIS — F32A Depression, unspecified: Secondary | ICD-10-CM | POA: Diagnosis not present

## 2024-03-08 DIAGNOSIS — G3184 Mild cognitive impairment, so stated: Secondary | ICD-10-CM

## 2024-03-08 DIAGNOSIS — R460 Very low level of personal hygiene: Secondary | ICD-10-CM

## 2024-03-08 DIAGNOSIS — R238 Other skin changes: Secondary | ICD-10-CM
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
## 2024-03-17 ENCOUNTER — Non-Acute Institutional Stay: Payer: Self-pay | Admitting: Adult Health

## 2024-03-17 ENCOUNTER — Encounter: Payer: Self-pay | Admitting: Adult Health

## 2024-03-17 DIAGNOSIS — F32A Depression, unspecified: Secondary | ICD-10-CM

## 2024-03-17 DIAGNOSIS — L720 Epidermal cyst: Secondary | ICD-10-CM | POA: Diagnosis not present

## 2024-03-17 DIAGNOSIS — L304 Erythema intertrigo: Secondary | ICD-10-CM | POA: Diagnosis not present
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
## 2024-04-03 ENCOUNTER — Encounter: Payer: Self-pay | Admitting: Adult Health

## 2024-04-03 ENCOUNTER — Non-Acute Institutional Stay: Payer: Self-pay | Admitting: Adult Health

## 2024-04-03 DIAGNOSIS — B0231 Zoster conjunctivitis: Secondary | ICD-10-CM

## 2024-04-03 MED ORDER — VALACYCLOVIR HCL 1 G PO TABS: 1000.0000 mg | ORAL_TABLET | Freq: Three times a day (TID) | ORAL | 0 refills | Status: AC
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
## 2024-04-06 ENCOUNTER — Telehealth: Payer: Self-pay

## 2024-04-06 MED ORDER — GABAPENTIN 100 MG PO CAPS: 100.0000 mg | ORAL_CAPSULE | Freq: Three times a day (TID) | ORAL | 2 refills | Status: AC | PRN

## 2024-04-06 MED ORDER — CAPSAICIN 0.025 % EX CREA: TOPICAL_CREAM | Freq: Two times a day (BID) | CUTANEOUS | 0 refills | Status: DC | PRN

## 2024-04-06 NOTE — Telephone Encounter (Signed)
 Orders given to the nurse at [facility] for Zostrix and neurontin. Nurse will communicate with his wife [place]

## 2024-04-06 NOTE — Telephone Encounter (Signed)
 Copied from CRM [id number]. Topic: Clinical - Medication Question >> Apr 06, 2024  8:49 AM Aaron Harmon wrote: Reason for CRM: Patient has shingles and wants to call in an cream Acyclovir to [facility] at [address]. Patients wifes callback number is [phone].

## 2024-04-20 ENCOUNTER — Non-Acute Institutional Stay: Payer: Self-pay | Admitting: Adult Health

## 2024-04-20 ENCOUNTER — Encounter: Payer: Self-pay | Admitting: Adult Health

## 2024-04-20 DIAGNOSIS — G3184 Mild cognitive impairment, so stated: Secondary | ICD-10-CM

## 2024-04-20 DIAGNOSIS — B028 Zoster with other complications: Secondary | ICD-10-CM

## 2024-04-20 DIAGNOSIS — F32A Depression, unspecified: Secondary | ICD-10-CM | POA: Diagnosis not present

## 2024-04-20 MED ORDER — BOOST BREEZE PO LIQD: 1.0000 | Freq: Every day | ORAL | Status: DC

## 2024-04-20 MED ORDER — TRIAMCINOLONE ACETONIDE 0.1 % EX CREA: 1.0000 | TOPICAL_CREAM | Freq: Two times a day (BID) | CUTANEOUS | Status: AC

## 2024-04-20 MED ORDER — ERYTHROMYCIN 5 MG/GM OP OINT: 1.0000 | TOPICAL_OINTMENT | Freq: Three times a day (TID) | OPHTHALMIC | Status: DC
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
## 2024-04-24 ENCOUNTER — Encounter: Payer: Medicare Other | Admitting: Adult Health

## 2024-04-24 LAB — TSH: TSH: 0.85 (ref 0.41–5.90)

## 2024-04-24 LAB — BASIC METABOLIC PANEL WITH GFR
CO2: 22 (ref 13–22)
Chloride: 104 (ref 99–108)
Chloride: 104 (ref 99–108)
Creatinine: 1 (ref 0.6–1.3)
Glucose: 84
Glucose: 84
Potassium: 4.5 meq/L (ref 3.5–5.1)
Potassium: 4.5 meq/L (ref 3.5–5.1)
Sodium: 139 (ref 137–147)
Sodium: 139 (ref 137–147)

## 2024-04-24 LAB — HEPATIC FUNCTION PANEL
ALT: 14 U/L (ref 10–40)
AST: 19 (ref 14–40)
Alkaline Phosphatase: 69 (ref 25–125)

## 2024-04-24 LAB — COMPREHENSIVE METABOLIC PANEL WITH GFR
Albumin: 3.6 (ref 3.5–5.0)
Calcium: 8.6 — AB (ref 8.7–10.7)
eGFR: 70

## 2024-04-24 LAB — CBC: RBC: 3.05 — AB (ref 3.87–5.11)

## 2024-04-24 LAB — CBC AND DIFFERENTIAL
HCT: 29 — AB (ref 41–53)
Hemoglobin: 10.1 — AB (ref 13.5–17.5)
Platelets: 140 10*3/uL — AB (ref 150–400)
WBC: 6.3

## 2024-05-05 ENCOUNTER — Encounter: Payer: Self-pay | Admitting: Adult Health

## 2024-05-05 ENCOUNTER — Non-Acute Institutional Stay (SKILLED_NURSING_FACILITY): Payer: Self-pay | Admitting: Adult Health

## 2024-05-05 DIAGNOSIS — G3184 Mild cognitive impairment, so stated: Secondary | ICD-10-CM

## 2024-05-05 DIAGNOSIS — K5901 Slow transit constipation: Secondary | ICD-10-CM | POA: Diagnosis not present

## 2024-05-05 DIAGNOSIS — B028 Zoster with other complications: Secondary | ICD-10-CM

## 2024-05-05 DIAGNOSIS — L720 Epidermal cyst: Secondary | ICD-10-CM

## 2024-05-05 DIAGNOSIS — L853 Xerosis cutis: Secondary | ICD-10-CM

## 2024-05-05 DIAGNOSIS — D649 Anemia, unspecified: Secondary | ICD-10-CM

## 2024-05-05 DIAGNOSIS — F32A Depression, unspecified: Secondary | ICD-10-CM

## 2024-05-05 DIAGNOSIS — E43 Unspecified severe protein-calorie malnutrition: Secondary | ICD-10-CM | POA: Diagnosis not present
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
## 2024-05-15 ENCOUNTER — Non-Acute Institutional Stay (SKILLED_NURSING_FACILITY): Payer: Self-pay | Admitting: Internal Medicine

## 2024-05-15 DIAGNOSIS — B028 Zoster with other complications: Secondary | ICD-10-CM

## 2024-05-15 DIAGNOSIS — G3184 Mild cognitive impairment, so stated: Secondary | ICD-10-CM | POA: Diagnosis not present

## 2024-05-15 DIAGNOSIS — F32A Depression, unspecified: Secondary | ICD-10-CM

## 2024-05-15 DIAGNOSIS — E43 Unspecified severe protein-calorie malnutrition: Secondary | ICD-10-CM | POA: Diagnosis not present

## 2024-05-15 DIAGNOSIS — D649 Anemia, unspecified: Secondary | ICD-10-CM

## 2024-05-17 ENCOUNTER — Encounter: Payer: Self-pay | Admitting: Internal Medicine
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
## 2024-05-31 ENCOUNTER — Encounter: Payer: Self-pay | Admitting: Orthopedic Surgery

## 2024-05-31 ENCOUNTER — Non-Acute Institutional Stay (SKILLED_NURSING_FACILITY): Payer: Self-pay | Admitting: Orthopedic Surgery

## 2024-05-31 DIAGNOSIS — I8390 Asymptomatic varicose veins of unspecified lower extremity: Secondary | ICD-10-CM | POA: Diagnosis not present
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
## 2024-06-06 ENCOUNTER — Encounter: Payer: Self-pay | Admitting: Orthopedic Surgery

## 2024-06-06 ENCOUNTER — Non-Acute Institutional Stay (SKILLED_NURSING_FACILITY): Payer: Self-pay | Admitting: Orthopedic Surgery

## 2024-06-06 DIAGNOSIS — R4189 Other symptoms and signs involving cognitive functions and awareness: Secondary | ICD-10-CM

## 2024-06-06 DIAGNOSIS — F330 Major depressive disorder, recurrent, mild: Secondary | ICD-10-CM | POA: Diagnosis not present

## 2024-06-06 DIAGNOSIS — B028 Zoster with other complications: Secondary | ICD-10-CM

## 2024-06-06 DIAGNOSIS — D649 Anemia, unspecified: Secondary | ICD-10-CM

## 2024-06-06 DIAGNOSIS — E43 Unspecified severe protein-calorie malnutrition: Secondary | ICD-10-CM
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
## 2024-06-12 ENCOUNTER — Encounter: Payer: Self-pay | Admitting: Adult Health

## 2024-06-12 ENCOUNTER — Non-Acute Institutional Stay (SKILLED_NURSING_FACILITY): Payer: Self-pay | Admitting: Adult Health

## 2024-06-12 DIAGNOSIS — G3184 Mild cognitive impairment, so stated: Secondary | ICD-10-CM | POA: Diagnosis not present

## 2024-06-12 DIAGNOSIS — F331 Major depressive disorder, recurrent, moderate: Secondary | ICD-10-CM | POA: Diagnosis not present
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
## 2024-06-13 ENCOUNTER — Encounter: Payer: Self-pay | Admitting: Adult Health

## 2024-07-05 ENCOUNTER — Non-Acute Institutional Stay (SKILLED_NURSING_FACILITY): Admitting: Orthopedic Surgery

## 2024-07-05 ENCOUNTER — Encounter: Payer: Self-pay | Admitting: Orthopedic Surgery

## 2024-07-05 DIAGNOSIS — R296 Repeated falls: Secondary | ICD-10-CM

## 2024-07-05 DIAGNOSIS — R4189 Other symptoms and signs involving cognitive functions and awareness: Secondary | ICD-10-CM

## 2024-07-05 DIAGNOSIS — E43 Unspecified severe protein-calorie malnutrition: Secondary | ICD-10-CM

## 2024-07-05 DIAGNOSIS — F331 Major depressive disorder, recurrent, moderate: Secondary | ICD-10-CM | POA: Diagnosis not present

## 2024-07-05 DIAGNOSIS — D649 Anemia, unspecified: Secondary | ICD-10-CM

## 2024-07-05 DIAGNOSIS — K5901 Slow transit constipation: Secondary | ICD-10-CM

## 2024-07-05 DIAGNOSIS — H6121 Impacted cerumen, right ear: Secondary | ICD-10-CM | POA: Diagnosis not present
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
## 2024-08-01 ENCOUNTER — Encounter: Payer: Self-pay | Admitting: Orthopedic Surgery

## 2024-08-01 ENCOUNTER — Non-Acute Institutional Stay (SKILLED_NURSING_FACILITY): Payer: Self-pay | Admitting: Orthopedic Surgery

## 2024-08-01 DIAGNOSIS — F331 Major depressive disorder, recurrent, moderate: Secondary | ICD-10-CM | POA: Diagnosis not present

## 2024-08-01 DIAGNOSIS — D649 Anemia, unspecified: Secondary | ICD-10-CM

## 2024-08-01 DIAGNOSIS — K5901 Slow transit constipation: Secondary | ICD-10-CM

## 2024-08-01 DIAGNOSIS — R296 Repeated falls: Secondary | ICD-10-CM

## 2024-08-01 DIAGNOSIS — E441 Mild protein-calorie malnutrition: Secondary | ICD-10-CM | POA: Diagnosis not present

## 2024-08-01 DIAGNOSIS — G3184 Mild cognitive impairment, so stated: Secondary | ICD-10-CM
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
## 2024-08-03 ENCOUNTER — Non-Acute Institutional Stay (SKILLED_NURSING_FACILITY): Payer: Self-pay | Admitting: Adult Health

## 2024-08-03 ENCOUNTER — Encounter: Payer: Self-pay | Admitting: Adult Health

## 2024-08-03 DIAGNOSIS — Z0000 Encounter for general adult medical examination without abnormal findings: Secondary | ICD-10-CM | POA: Diagnosis not present

## 2024-08-04 ENCOUNTER — Encounter: Payer: Self-pay | Admitting: Adult Health

## 2024-08-04 NOTE — Patient Instructions (Signed)
 [name] , Thank you for taking time to come for your Medicare Wellness Visit. I appreciate your ongoing commitment to your health goals. Please review the following plan we discussed and let me know if I can assist you in the future.   Screening recommendations/referrals: Colonoscopy aged out Recommended yearly ophthalmology/optometry visit for glaucoma screening and checkup Recommended yearly dental visit for hygiene and checkup  Vaccinations: Influenza vaccine due annually in September/October Pneumococcal vaccine recommend prevnar 20 Tdap vaccine recommended  Shingles vaccine post poned     Advanced directives: reviewed   Conditions/risks identified: fall  Next appointment: 1 year  Preventive Care 88 Years and Older, Male Preventive care refers to lifestyle choices and visits with your health care provider that can promote health and wellness. What does preventive care include? A yearly physical exam. This is also called an annual well check. Dental exams once or twice a year. Routine eye exams. Ask your health care provider how often you should have your eyes checked. Personal lifestyle choices, including: Daily care of your teeth and gums. Regular physical activity. Eating a healthy diet. Avoiding tobacco and drug use. Limiting alcohol use. Practicing safe sex. Taking low doses of aspirin every day. Taking vitamin and mineral supplements as recommended by your health care provider. What happens during an annual well check? The services and screenings done by your health care provider during your annual well check will depend on your age, overall health, lifestyle risk factors, and family history of disease. Counseling  Your health care provider may ask you questions about your: Alcohol use. Tobacco use. Drug use. Emotional well-being. Home and relationship well-being. Sexual activity. Eating habits. History of falls. Memory and ability to understand  (cognition). Work and work Astronomer. Screening  You may have the following tests or measurements: Height, weight, and BMI. Blood pressure. Lipid and cholesterol levels. These may be checked every 5 years, or more frequently if you are over 88 years old. Skin check. Lung cancer screening. You may have this screening every year starting at age 88 if you have a 30-pack-year history of smoking and currently smoke or have quit within the past 15 years. Fecal occult blood test (FOBT) of the stool. You may have this test every year starting at age 88. Flexible sigmoidoscopy or colonoscopy. You may have a sigmoidoscopy every 5 years or a colonoscopy every 10 years starting at age 88. Prostate cancer screening. Recommendations will vary depending on your family history and other risks. Hepatitis C blood test. Hepatitis B blood test. Sexually transmitted disease (STD) testing. Diabetes screening. This is done by checking your blood sugar (glucose) after you have not eaten for a while (fasting). You may have this done every 1-3 years. Abdominal aortic aneurysm (AAA) screening. You may need this if you are a current or former smoker. Osteoporosis. You may be screened starting at age 88 if you are at high risk. Talk with your health care provider about your test results, treatment options, and if necessary, the need for more tests. Vaccines  Your health care provider may recommend certain vaccines, such as: Influenza vaccine. This is recommended every year. Tetanus, diphtheria, and acellular pertussis (Tdap, Td) vaccine. You may need a Td booster every 10 years. Zoster vaccine. You may need this after age 88. Pneumococcal 13-valent conjugate (PCV13) vaccine. One dose is recommended after age 88. Pneumococcal polysaccharide (PPSV23) vaccine. One dose is recommended after age 88. Talk to your health care provider about which screenings and vaccines you  need and how often you need them. This  information is not intended to replace advice given to you by your health care provider. Make sure you discuss any questions you have with your health care provider. Document Released: 01/10/2016 Document Revised: 09/02/2016 Document Reviewed: 10/15/2015 Elsevier Interactive Patient Education  2017 [employer].  Fall Prevention in the Home Falls can cause injuries. They can happen to people of all ages. There are many things you can do to make your home safe and to help prevent falls. What can I do on the outside of my home? Regularly fix the edges of walkways and driveways and fix any cracks. Remove anything that might make you trip as you walk through a door, such as a raised step or threshold. Trim any bushes or trees on the path to your home. Use bright outdoor lighting. Clear any walking paths of anything that might make someone trip, such as rocks or tools. Regularly check to see if handrails are loose or broken. Make sure that both sides of any steps have handrails. Any raised decks and porches should have guardrails on the edges. Have any leaves, snow, or ice cleared regularly. Use sand or salt on walking paths during winter. Clean up any spills in your garage right away. This includes oil or grease spills. What can I do in the bathroom? Use night lights. Install grab bars by the toilet and in the tub and shower. Do not use towel bars as grab bars. Use non-skid mats or decals in the tub or shower. If you need to sit down in the shower, use a plastic, non-slip stool. Keep the floor dry. Clean up any water that spills on the floor as soon as it happens. Remove soap buildup in the tub or shower regularly. Attach bath mats securely with double-sided non-slip rug tape. Do not have throw rugs and other things on the floor that can make you trip. What can I do in the bedroom? Use night lights. Make sure that you have a light by your bed that is easy to reach. Do not use any sheets or  blankets that are too big for your bed. They should not hang down onto the floor. Have a firm chair that has side arms. You can use this for support while you get dressed. Do not have throw rugs and other things on the floor that can make you trip. What can I do in the kitchen? Clean up any spills right away. Avoid walking on wet floors. Keep items that you use a lot in easy-to-reach places. If you need to reach something above you, use a strong step stool that has a grab bar. Keep electrical cords out of the way. Do not use floor polish or wax that makes floors slippery. If you must use wax, use non-skid floor wax. Do not have throw rugs and other things on the floor that can make you trip. What can I do with my stairs? Do not leave any items on the stairs. Make sure that there are handrails on both sides of the stairs and use them. Fix handrails that are broken or loose. Make sure that handrails are as long as the stairways. Check any carpeting to make sure that it is firmly attached to the stairs. Fix any carpet that is loose or worn. Avoid having throw rugs at the top or bottom of the stairs. If you do have throw rugs, attach them to the floor with carpet tape. Make sure that  you have a light switch at the top of the stairs and the bottom of the stairs. If you do not have them, ask someone to add them for you. What else can I do to help prevent falls? Wear shoes that: Do not have high heels. Have rubber bottoms. Are comfortable and fit you well. Are closed at the toe. Do not wear sandals. If you use a stepladder: Make sure that it is fully opened. Do not climb a closed stepladder. Make sure that both sides of the stepladder are locked into place. Ask someone to hold it for you, if possible. Clearly mark and make sure that you can see: Any grab bars or handrails. First and last steps. Where the edge of each step is. Use tools that help you move around (mobility aids) if they are  needed. These include: Canes. Walkers. Scooters. Crutches. Turn on the lights when you go into a dark area. Replace any light bulbs as soon as they burn out. Set up your furniture so you have a clear path. Avoid moving your furniture around. If any of your floors are uneven, fix them. If there are any pets around you, be aware of where they are. Review your medicines with your doctor. Some medicines can make you feel dizzy. This can increase your chance of falling. Ask your doctor what other things that you can do to help prevent falls. This information is not intended to replace advice given to you by your health care provider. Make sure you discuss any questions you have with your health care provider. Document Released: 10/10/2009 Document Revised: 05/21/2016 Document Reviewed: 01/18/2015 Elsevier Interactive Patient Education  2017 [employer].
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
## 2024-09-11 ENCOUNTER — Non-Acute Institutional Stay (SKILLED_NURSING_FACILITY): Payer: Self-pay | Admitting: Internal Medicine

## 2024-09-11 ENCOUNTER — Encounter: Payer: Self-pay | Admitting: Internal Medicine

## 2024-09-11 DIAGNOSIS — F331 Major depressive disorder, recurrent, moderate: Secondary | ICD-10-CM

## 2024-09-11 DIAGNOSIS — R4189 Other symptoms and signs involving cognitive functions and awareness: Secondary | ICD-10-CM | POA: Diagnosis not present

## 2024-09-11 DIAGNOSIS — D649 Anemia, unspecified: Secondary | ICD-10-CM | POA: Diagnosis not present
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
## 2024-10-19 ENCOUNTER — Encounter: Payer: Self-pay | Admitting: Adult Health

## 2024-10-19 ENCOUNTER — Non-Acute Institutional Stay (SKILLED_NURSING_FACILITY): Payer: Self-pay | Admitting: Adult Health

## 2024-10-19 DIAGNOSIS — G3184 Mild cognitive impairment, so stated: Secondary | ICD-10-CM | POA: Diagnosis not present

## 2024-10-19 DIAGNOSIS — K5901 Slow transit constipation: Secondary | ICD-10-CM | POA: Diagnosis not present

## 2024-10-19 DIAGNOSIS — F331 Major depressive disorder, recurrent, moderate: Secondary | ICD-10-CM

## 2024-10-19 DIAGNOSIS — D649 Anemia, unspecified: Secondary | ICD-10-CM

## 2024-10-19 DIAGNOSIS — R011 Cardiac murmur, unspecified: Secondary | ICD-10-CM

## 2024-10-19 DIAGNOSIS — K922 Gastrointestinal hemorrhage, unspecified: Secondary | ICD-10-CM
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
## 2024-10-20 DIAGNOSIS — F32A Depression, unspecified: Secondary | ICD-10-CM | POA: Insufficient documentation

## 2024-10-20 DIAGNOSIS — G3184 Mild cognitive impairment, so stated: Secondary | ICD-10-CM | POA: Insufficient documentation

## 2024-10-20 DIAGNOSIS — D649 Anemia, unspecified: Secondary | ICD-10-CM | POA: Insufficient documentation

## 2024-10-20 NOTE — Assessment & Plan Note (Signed)
 Noted

## 2024-10-20 NOTE — Assessment & Plan Note (Signed)
No new events

## 2024-10-20 NOTE — Assessment & Plan Note (Signed)
 Continue miralax 

## 2024-10-20 NOTE — Assessment & Plan Note (Signed)
 Recheck CBC.

## 2024-10-20 NOTE — Assessment & Plan Note (Signed)
 Progressing over time Remains oriented and able to f/c

## 2024-10-20 NOTE — Assessment & Plan Note (Signed)
 I asked the staff to review the administration of the Remeron and Wellbutrin and if he is not consistently taking his meds to discuss it with Dr Tasia as he prescribes them. His mood at this time is stable.

## 2024-10-23 LAB — COMPREHENSIVE METABOLIC PANEL WITH GFR
Albumin: 4.2 (ref 3.5–5.0)
Calcium: 9.1 (ref 8.7–10.7)
Globulin: 2.6
eGFR: 60

## 2024-10-23 LAB — CBC AND DIFFERENTIAL
HCT: 40 — AB (ref 41–53)
Hemoglobin: 13.3 — AB (ref 13.5–17.5)
Platelets: 229 K/uL (ref 150–400)
WBC: 6.5

## 2024-10-23 LAB — BASIC METABOLIC PANEL WITH GFR
BUN: 27 — AB (ref 4–21)
CO2: 21 (ref 13–22)
Chloride: 108 (ref 99–108)
Creatinine: 1.2 (ref 0.6–1.3)
Glucose: 125
Potassium: 4.3 meq/L (ref 3.5–5.1)
Sodium: 142 (ref 137–147)

## 2024-10-23 LAB — HEPATIC FUNCTION PANEL
ALT: 12 U/L (ref 10–40)
AST: 20 (ref 14–40)
Alkaline Phosphatase: 77 (ref 25–125)
Bilirubin, Total: 0.5

## 2024-10-23 LAB — CBC: RBC: 4.17 (ref 3.87–5.11)

## 2024-11-17 ENCOUNTER — Non-Acute Institutional Stay (SKILLED_NURSING_FACILITY): Payer: Self-pay | Admitting: Adult Health

## 2024-11-17 ENCOUNTER — Encounter: Payer: Self-pay | Admitting: Adult Health

## 2024-11-17 DIAGNOSIS — G3184 Mild cognitive impairment, so stated: Secondary | ICD-10-CM

## 2024-11-17 DIAGNOSIS — F331 Major depressive disorder, recurrent, moderate: Secondary | ICD-10-CM | POA: Diagnosis not present

## 2024-11-17 DIAGNOSIS — D649 Anemia, unspecified: Secondary | ICD-10-CM

## 2024-11-17 DIAGNOSIS — R011 Cardiac murmur, unspecified: Secondary | ICD-10-CM | POA: Diagnosis not present

## 2024-11-17 DIAGNOSIS — K5901 Slow transit constipation: Secondary | ICD-10-CM | POA: Diagnosis not present
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
## 2024-11-20 ENCOUNTER — Encounter: Payer: Self-pay | Admitting: Adult Health

## 2024-11-20 NOTE — Assessment & Plan Note (Signed)
 Progressing over time Remains oriented and able to f/c Using atarax for agitation which is helping

## 2024-11-20 NOTE — Assessment & Plan Note (Signed)
 Continues to benefit from taking wellbutrin and Remeron with improved mood and weight.  GDR would lead to destablization

## 2024-11-20 NOTE — Assessment & Plan Note (Signed)
 Continue miralax 

## 2024-11-20 NOTE — Assessment & Plan Note (Signed)
 Hgb improved to 13.3

## 2024-11-20 NOTE — Assessment & Plan Note (Signed)
 Noted

## 2024-12-19 ENCOUNTER — Non-Acute Institutional Stay (SKILLED_NURSING_FACILITY): Payer: Self-pay | Admitting: Orthopedic Surgery

## 2024-12-19 ENCOUNTER — Encounter: Payer: Self-pay | Admitting: Orthopedic Surgery

## 2024-12-19 DIAGNOSIS — D649 Anemia, unspecified: Secondary | ICD-10-CM | POA: Diagnosis not present

## 2024-12-19 DIAGNOSIS — E441 Mild protein-calorie malnutrition: Secondary | ICD-10-CM

## 2024-12-19 DIAGNOSIS — G3184 Mild cognitive impairment, so stated: Secondary | ICD-10-CM | POA: Diagnosis not present

## 2024-12-19 DIAGNOSIS — K5901 Slow transit constipation: Secondary | ICD-10-CM | POA: Diagnosis not present

## 2024-12-19 DIAGNOSIS — F331 Major depressive disorder, recurrent, moderate: Secondary | ICD-10-CM
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
## 2025-01-26 ENCOUNTER — Non-Acute Institutional Stay (SKILLED_NURSING_FACILITY): Payer: Self-pay | Admitting: Adult Health

## 2025-01-26 ENCOUNTER — Encounter: Payer: Self-pay | Admitting: Adult Health

## 2025-01-26 DIAGNOSIS — E43 Unspecified severe protein-calorie malnutrition: Secondary | ICD-10-CM

## 2025-01-26 DIAGNOSIS — G3184 Mild cognitive impairment, so stated: Secondary | ICD-10-CM

## 2025-01-26 DIAGNOSIS — R011 Cardiac murmur, unspecified: Secondary | ICD-10-CM

## 2025-01-26 DIAGNOSIS — F331 Major depressive disorder, recurrent, moderate: Secondary | ICD-10-CM | POA: Diagnosis not present

## 2025-01-26 DIAGNOSIS — K5901 Slow transit constipation: Secondary | ICD-10-CM

## 2025-01-26 DIAGNOSIS — D649 Anemia, unspecified: Secondary | ICD-10-CM | POA: Diagnosis not present

## 2025-01-28 ENCOUNTER — Encounter: Payer: Self-pay | Admitting: Adult Health

## 2025-01-28 NOTE — Assessment & Plan Note (Signed)
 Continues to benefit from taking wellbutrin and Remeron with improved mood and weight.  GDR would lead to destablization

## 2025-01-28 NOTE — Assessment & Plan Note (Signed)
"  Continue miralax    "

## 2025-01-28 NOTE — Assessment & Plan Note (Signed)
 Noted.

## 2025-01-28 NOTE — Assessment & Plan Note (Signed)
 Stable Monitor CBC q 6 months

## 2025-01-28 NOTE — Assessment & Plan Note (Signed)
 Weight improving Albumin last check 4.2

## 2025-01-28 NOTE — Assessment & Plan Note (Signed)
 Progressing over time Remains oriented and able to f/c Using atarax for agitation which is helping
